# Patient Record
Sex: Female | Born: 1990 | Race: Black or African American | Hispanic: No | Marital: Single | Smoking: Former smoker
Health system: Southern US, Community
[De-identification: ages and names within clinical notes are randomized; demographics above are authoritative.]

## PROBLEM LIST (undated history)

## (undated) ENCOUNTER — Emergency Department (HOSPITAL_COMMUNITY): Admission: EM | Payer: Self-pay | Source: Home / Self Care

## (undated) DIAGNOSIS — L0232 Furuncle of buttock: Secondary | ICD-10-CM

## (undated) DIAGNOSIS — J189 Pneumonia, unspecified organism: Secondary | ICD-10-CM

## (undated) HISTORY — PX: WISDOM TOOTH EXTRACTION: SHX21

---

## 2012-02-17 ENCOUNTER — Encounter (HOSPITAL_BASED_OUTPATIENT_CLINIC_OR_DEPARTMENT_OTHER): Payer: Self-pay | Admitting: Student

## 2012-02-17 DIAGNOSIS — L03119 Cellulitis of unspecified part of limb: Secondary | ICD-10-CM | POA: Insufficient documentation

## 2012-02-17 DIAGNOSIS — L02419 Cutaneous abscess of limb, unspecified: Secondary | ICD-10-CM | POA: Insufficient documentation

## 2012-02-17 NOTE — ED Notes (Signed)
Pt in with c/o boils to back of both legs.

## 2012-02-18 ENCOUNTER — Emergency Department (HOSPITAL_BASED_OUTPATIENT_CLINIC_OR_DEPARTMENT_OTHER)
Admission: EM | Admit: 2012-02-18 | Discharge: 2012-02-18 | Disposition: A | Discharge status: Home / Self Care | Payer: Self-pay | Attending: Emergency Medicine | Admitting: Emergency Medicine

## 2012-02-18 DIAGNOSIS — L0291 Cutaneous abscess, unspecified: Secondary | ICD-10-CM

## 2012-02-18 MED ORDER — OXYCODONE-ACETAMINOPHEN 5-325 MG PO TABS: 1.0000 | ORAL_TABLET | ORAL | Status: AC | PRN

## 2012-02-18 MED ORDER — SULFAMETHOXAZOLE-TRIMETHOPRIM 800-160 MG PO TABS: 1.0000 | ORAL_TABLET | Freq: Two times a day (BID) | ORAL | Status: AC

## 2012-02-18 MED ORDER — OXYCODONE-ACETAMINOPHEN 5-325 MG PO TABS
1.0000 | ORAL_TABLET | Freq: Once | ORAL | Status: AC
  Administered 2012-02-18: 1 via ORAL
  Filled 2012-02-18: qty 1

## 2012-02-18 MED ORDER — LIDOCAINE-EPINEPHRINE 2 %-1:100000 IJ SOLN
INTRAMUSCULAR | Status: AC
  Filled 2012-02-18: qty 1

## 2012-02-18 NOTE — ED Provider Notes (Signed)
History     CSN: [id number]  Arrival date & time 02/17/12  2319   First MD Initiated Contact with Patient 02/18/12 [phone]    Chief Complaint  Patient presents with  . Abscess    both legs     Patient is a 21 y.o. female presenting with abscess. The history is provided by the patient.  Abscess  This is a new problem. The current episode started less than one week ago. The onset was gradual. The problem has been gradually worsening. Affected Location: bilateral posterior thighs. The problem is moderate. Pertinent negatives include no fever and no vomiting.  worsened by - palpation Improved by - rest  Pt reports abscesses to bilateral posterior thighs   PMH - none  History reviewed. No pertinent past surgical history.  History reviewed. No pertinent family history.  History  Substance Use Topics  . Smoking status: Current Everyday Smoker  . Smokeless tobacco: Not on file  . Alcohol Use: Yes    OB History    Grav Para Term Preterm Abortions TAB SAB Ect Mult Living                  Review of Systems  Constitutional: Negative for fever.  Gastrointestinal: Negative for vomiting.    Allergies  Review of patient's allergies indicates no known allergies.  Home Medications   Current Outpatient Rx  Name Route Sig Dispense Refill  . OXYCODONE-ACETAMINOPHEN 5-325 MG PO TABS Oral Take 1 tablet by mouth every 4 (four) hours as needed for pain. 15 tablet 0  . SULFAMETHOXAZOLE-TRIMETHOPRIM 800-160 MG PO TABS Oral Take 1 tablet by mouth every 12 (twelve) hours. 10 tablet 0    BP 121/78  Pulse 79  Temp(Src) 98.5 F (36.9 C) (Oral)  Resp 20  Ht 5\' 9"  (1.753 m)  Wt 280 lb (127.007 kg)  BMI 41.35 kg/m2  SpO2 100%  LMP 02/10/2012  Physical Exam CONSTITUTIONAL: Well developed/well nourished HEAD AND FACE: Normocephalic/atraumatic EYES: EOMI/PERRL ENMT: Mucous membranes moist NECK: supple no meningeal signs SPINE:entire spine nontender CV: S1/S2 noted, no  murmurs/rubs/gallops noted LUNGS: Lungs are clear to auscultation bilaterally, no apparent distress ABDOMEN: soft, nontender, no rebound or guarding NEURO: Pt is awake/alert, moves all extremitiesx4 EXTREMITIES: pulses normal, full ROM Pt has an abscess to the bilateral posterior thighs that are both actively draining with mild erythema surrounding them No crepitance noted.  There is no extension into buttocks.   SKIN: warm, color normal PSYCH: no abnormalities of mood noted   ED Course  Procedures     1. Abscess       MDM  Nursing notes reviewed and considered in documentation  abscesses are already draining and do not require surgical drainage, will start bactrim and pain meds  The patient appears reasonably screened and/or stabilized for discharge and I doubt any other medical condition or other [facility] requiring further screening, evaluation, or treatment in the ED at this time prior to discharge.         Joya Gaskins, MD 02/18/12 [phone]

## 2012-02-18 NOTE — ED Notes (Signed)
MD at bedside. 

## 2012-02-18 NOTE — Discharge Instructions (Signed)

## 2012-09-26 ENCOUNTER — Encounter (HOSPITAL_BASED_OUTPATIENT_CLINIC_OR_DEPARTMENT_OTHER): Payer: Self-pay | Admitting: *Deleted

## 2012-09-26 ENCOUNTER — Emergency Department (HOSPITAL_BASED_OUTPATIENT_CLINIC_OR_DEPARTMENT_OTHER)
Admission: EM | Admit: 2012-09-26 | Discharge: 2012-09-26 | Disposition: A | Discharge status: Home / Self Care | Payer: Self-pay | Attending: Emergency Medicine | Admitting: Emergency Medicine

## 2012-09-26 DIAGNOSIS — L0501 Pilonidal cyst with abscess: Secondary | ICD-10-CM | POA: Insufficient documentation

## 2012-09-26 HISTORY — DX: Furuncle of buttock: L02.32

## 2012-09-26 MED ORDER — LIDOCAINE HCL 2 % IJ SOLN
INTRAMUSCULAR | Status: AC
  Filled 2012-09-26: qty 20

## 2012-09-26 MED ORDER — HYDROCODONE-ACETAMINOPHEN 5-325 MG PO TABS: 2.0000 | ORAL_TABLET | ORAL | Status: AC | PRN

## 2012-09-26 MED ORDER — SULFAMETHOXAZOLE-TRIMETHOPRIM 800-160 MG PO TABS: 1.0000 | ORAL_TABLET | Freq: Two times a day (BID) | ORAL | Status: DC

## 2012-09-26 MED ORDER — HYDROCODONE-ACETAMINOPHEN 5-325 MG PO TABS
2.0000 | ORAL_TABLET | Freq: Once | ORAL | Status: AC
  Administered 2012-09-26: 2 via ORAL
  Filled 2012-09-26: qty 2

## 2012-09-26 NOTE — ED Provider Notes (Signed)
History     CSN: [id number]  Arrival date & time 09/26/12  2023   First MD Initiated Contact with Patient 09/26/12 2142      Chief Complaint  Patient presents with  . Recurrent Skin Infections    (Consider location/radiation/quality/duration/timing/severity/associated sxs/prior treatment) Patient is a 21 y.o. female presenting with abscess. The history is provided by the patient. No language interpreter was used.  Abscess  This is a new problem. The current episode started more than one week ago. The onset was gradual. The problem occurs continuously. The problem has been gradually worsening. The abscess is present on the left buttock. The problem is severe. The abscess is characterized by itchiness and redness. The abscess first occurred at home.  Pt reports she has had multiple abscess in the past  Past Medical History  Diagnosis Date  . Boil of buttock     Past Surgical History  Procedure Date  . Cesarean section   . Wisdom tooth extraction     No family history on file.  History  Substance Use Topics  . Smoking status: Never Smoker   . Smokeless tobacco: Never Used  . Alcohol Use: No    OB History    Grav Para Term Preterm Abortions TAB SAB Ect Mult Living                  Review of Systems  Skin: Positive for wound.  All other systems reviewed and are negative.    Allergies  Review of patient's allergies indicates no known allergies.  Home Medications  No current outpatient prescriptions on file.  BP 127/83  Pulse 111  Temp 98.7 F (37.1 C) (Oral)  Resp 20  SpO2 97%  LMP 09/17/2012  Physical Exam  Nursing note and vitals reviewed. Constitutional: She appears well-developed and well-nourished.  HENT:  Head: Normocephalic.  Eyes: Pupils are equal, round, and reactive to light.  Cardiovascular: Normal rate.   Pulmonary/Chest: Effort normal.  Abdominal: Soft.  Musculoskeletal: She exhibits tenderness.       Abscess pilonidal area,   fluctuant  Skin: Skin is warm.  Psychiatric: She has a normal mood and affect.    ED Course  INCISION AND DRAINAGE Date/Time: 09/26/2012 10:48 PM Performed by: Elson Areas Authorized by: Elson Areas Consent: Verbal consent not obtained. Risks and benefits: risks, benefits and alternatives were discussed Consent given by: patient Patient identity confirmed: verbally with patient Time out: Immediately prior to procedure a "time out" was called to verify the correct patient, procedure, equipment, support staff and site/side marked as required. Type: pilonidal cyst Body area: anogenital Location details: pilonidal Anesthesia: local infiltration Local anesthetic: lidocaine 2% without epinephrine Patient sedated: no Scalpel size: 11 Incision type: single straight Complexity: simple Drainage: purulent Drainage amount: copious Wound treatment: wound left open Packing material: 1/2 in iodoform gauze Patient tolerance: Patient tolerated the procedure well with no immediate complications.   (including critical care time)  Labs Reviewed - No data to display No results found.   No diagnosis found.    MDM  Pt advised to return in 2 days for recheck and packing removal.   Pt given rx for bactrim and hydrocodone        Lonia Skinner [place], [place] 09/26/12 2250

## 2012-09-26 NOTE — ED Notes (Signed)
Pt c/o boil on buttocks- hx of same over past 2 years

## 2012-09-26 NOTE — ED Provider Notes (Signed)
Medical screening examination/treatment/procedure(s) were performed by non-physician practitioner and as supervising physician I was immediately available for consultation/collaboration.   Jahyra Sukup, MD 09/26/12 2308 

## 2012-12-18 ENCOUNTER — Emergency Department (HOSPITAL_BASED_OUTPATIENT_CLINIC_OR_DEPARTMENT_OTHER)
Admission: EM | Admit: 2012-12-18 | Discharge: 2012-12-18 | Disposition: A | Discharge status: Home / Self Care | Payer: Self-pay | Attending: Emergency Medicine | Admitting: Emergency Medicine

## 2012-12-18 ENCOUNTER — Other Ambulatory Visit (HOSPITAL_BASED_OUTPATIENT_CLINIC_OR_DEPARTMENT_OTHER): Payer: Self-pay

## 2012-12-18 ENCOUNTER — Emergency Department (HOSPITAL_BASED_OUTPATIENT_CLINIC_OR_DEPARTMENT_OTHER): Payer: Self-pay

## 2012-12-18 ENCOUNTER — Encounter (HOSPITAL_BASED_OUTPATIENT_CLINIC_OR_DEPARTMENT_OTHER): Payer: Self-pay | Admitting: *Deleted

## 2012-12-18 ENCOUNTER — Ambulatory Visit (HOSPITAL_COMMUNITY)
Admission: RE | Admit: 2012-12-18 | Discharge: 2012-12-18 | Disposition: A | Discharge status: Home / Self Care | Payer: Self-pay | Source: Ambulatory Visit | Attending: Emergency Medicine | Admitting: Emergency Medicine

## 2012-12-18 DIAGNOSIS — R059 Cough, unspecified: Secondary | ICD-10-CM | POA: Insufficient documentation

## 2012-12-18 DIAGNOSIS — L0231 Cutaneous abscess of buttock: Secondary | ICD-10-CM | POA: Insufficient documentation

## 2012-12-18 DIAGNOSIS — R799 Abnormal finding of blood chemistry, unspecified: Secondary | ICD-10-CM | POA: Insufficient documentation

## 2012-12-18 DIAGNOSIS — L0390 Cellulitis, unspecified: Secondary | ICD-10-CM

## 2012-12-18 DIAGNOSIS — Z8701 Personal history of pneumonia (recurrent): Secondary | ICD-10-CM | POA: Insufficient documentation

## 2012-12-18 DIAGNOSIS — Z3202 Encounter for pregnancy test, result negative: Secondary | ICD-10-CM | POA: Insufficient documentation

## 2012-12-18 DIAGNOSIS — R791 Abnormal coagulation profile: Secondary | ICD-10-CM | POA: Insufficient documentation

## 2012-12-18 DIAGNOSIS — Z872 Personal history of diseases of the skin and subcutaneous tissue: Secondary | ICD-10-CM | POA: Insufficient documentation

## 2012-12-18 DIAGNOSIS — Z711 Person with feared health complaint in whom no diagnosis is made: Secondary | ICD-10-CM | POA: Insufficient documentation

## 2012-12-18 DIAGNOSIS — M79609 Pain in unspecified limb: Secondary | ICD-10-CM

## 2012-12-18 DIAGNOSIS — L03317 Cellulitis of buttock: Secondary | ICD-10-CM | POA: Insufficient documentation

## 2012-12-18 DIAGNOSIS — R091 Pleurisy: Secondary | ICD-10-CM | POA: Insufficient documentation

## 2012-12-18 HISTORY — DX: Pneumonia, unspecified organism: J18.9

## 2012-12-18 LAB — CBC WITH DIFFERENTIAL/PLATELET
Basophils Relative: 0 % (ref 0–1)
Eosinophils Relative: 1 % (ref 0–5)
Hemoglobin: 10.6 g/dL — ABNORMAL LOW (ref 12.0–15.0)
MCH: 21.6 pg — ABNORMAL LOW (ref 26.0–34.0)
MCV: 67.3 fL — ABNORMAL LOW (ref 78.0–100.0)
Monocytes Absolute: 1.2 10*3/uL — ABNORMAL HIGH (ref 0.1–1.0)
Monocytes Relative: 10 % (ref 3–12)
Neutrophils Relative %: 72 % (ref 43–77)
RBC: 4.9 MIL/uL (ref 3.87–5.11)

## 2012-12-18 LAB — BASIC METABOLIC PANEL
BUN: 11 mg/dL (ref 6–23)
Chloride: 104 mEq/L (ref 96–112)
GFR calc Af Amer: >90 mL/min (ref >90)
Potassium: 4.1 mEq/L (ref 3.5–5.1)
Sodium: 138 mEq/L (ref 135–145)

## 2012-12-18 LAB — PREGNANCY, URINE: Preg Test, Ur: NEGATIVE

## 2012-12-18 MED ORDER — SULFAMETHOXAZOLE-TRIMETHOPRIM 800-160 MG PO TABS: 1.0000 | ORAL_TABLET | Freq: Two times a day (BID) | ORAL | Status: DC

## 2012-12-18 MED ORDER — BENZONATATE 100 MG PO CAPS: 100.0000 mg | ORAL_CAPSULE | Freq: Three times a day (TID) | ORAL | Status: DC

## 2012-12-18 MED ORDER — HYDROCODONE-ACETAMINOPHEN 5-325 MG PO TABS: 1.0000 | ORAL_TABLET | Freq: Four times a day (QID) | ORAL | Status: DC | PRN

## 2012-12-18 MED ORDER — SULFAMETHOXAZOLE-TMP DS 800-160 MG PO TABS
1.0000 | ORAL_TABLET | Freq: Once | ORAL | Status: AC
  Administered 2012-12-18: 1 via ORAL
  Filled 2012-12-18: qty 1

## 2012-12-18 MED ORDER — OXYCODONE-ACETAMINOPHEN 5-325 MG PO TABS
1.0000 | ORAL_TABLET | Freq: Once | ORAL | Status: AC
  Administered 2012-12-18: 1 via ORAL
  Filled 2012-12-18 (×2): qty 1

## 2012-12-18 MED ORDER — IOHEXOL 350 MG/ML SOLN
125.0000 mL | Freq: Once | INTRAVENOUS | Status: AC | PRN
  Administered 2012-12-18: 125 mL via INTRAVENOUS

## 2012-12-18 MED ORDER — NAPROXEN 375 MG PO TABS: 375.0000 mg | ORAL_TABLET | Freq: Two times a day (BID) | ORAL | Status: DC

## 2012-12-18 NOTE — ED Notes (Signed)
Patient returned from xray.

## 2012-12-18 NOTE — ED Notes (Signed)
Explained to patient that they were to report to [facility] ED to be seen in radiology for an ultrasound as an out patient, patient verbalized understanding

## 2012-12-18 NOTE — ED Notes (Signed)
Patient reports two weeks of pain in her lungs/ribs and an abscess area in her sacrum for about 4 days

## 2012-12-18 NOTE — ED Notes (Signed)
Opened chart to obtain Vascular order and clarify location of test.

## 2012-12-18 NOTE — ED Provider Notes (Signed)
History     CSN: [id number]  Arrival date & time 12/18/12  0508   First MD Initiated Contact with Patient 12/18/12 0518      Chief Complaint  Patient presents with  . Recurrent Skin Infections    (Consider location/radiation/quality/duration/timing/severity/associated sxs/prior treatment) Patient is a 22 y.o. female presenting with cough. The history is provided by the patient.  Cough Cough characteristics:  Non-productive Onset quality:  Gradual Duration:  3 weeks Timing:  Intermittent Progression:  Unchanged Chronicity:  New Smoker: no   Context: not sick contacts   Relieved by:  Nothing Worsened by:  Nothing tried Ineffective treatments:  None tried Risk factors: no recent travel   rib pain with coughing, non-productive x 3 weeks.  Also is concerned an abscess that she had in her gluteal region that was drained in December has returned.  She has had burning in that area and has been putting Vick's vapor rub on it. No fevers, no drainage   Past Medical History  Diagnosis Date  . Boil of buttock   . Pneumonia     Past Surgical History  Procedure Laterality Date  . Cesarean section    . Wisdom tooth extraction      No family history on file.  History  Substance Use Topics  . Smoking status: Never Smoker   . Smokeless tobacco: Never Used  . Alcohol Use: No    OB History   Grav Para Term Preterm Abortions TAB SAB Ect Mult Living                  Review of Systems  Respiratory: Positive for cough.   Cardiovascular: Negative for palpitations and leg swelling.  All other systems reviewed and are negative.    Allergies  Latex  Home Medications   Current Outpatient Rx  Name  Route  Sig  Dispense  Refill  . acetaminophen (TYLENOL) 325 MG tablet   Oral   Take 650 mg by mouth every 6 (six) hours as needed for pain.         . Etonogestrel (IMPLANON [place])   Subcutaneous   Inject into the skin.         Marland Kitchen sulfamethoxazole-trimethoprim (SEPTRA DS)  800-160 MG per tablet   Oral   Take 1 tablet by mouth every 12 (twelve) hours.   20 tablet   0     BP 130/75  Pulse 90  Temp(Src) 98.8 F (37.1 C) (Oral)  Resp 22  SpO2 99%  LMP 12/14/2012  Physical Exam  Constitutional: She is oriented to person, place, and time. She appears well-developed and well-nourished. No distress.  HENT:  Head: Normocephalic and atraumatic.  Mouth/Throat: Oropharynx is clear and moist.  Eyes: Conjunctivae are normal. Pupils are equal, round, and reactive to light.  Neck: Normal range of motion. Neck supple.  Cardiovascular: Normal rate, regular rhythm and intact distal pulses.   Pulmonary/Chest: Effort normal and breath sounds normal. No respiratory distress. She has no wheezes. She has no rales.  Abdominal: Soft. Bowel sounds are normal. There is no tenderness. There is no rebound and no guarding.  Musculoskeletal: Normal range of motion.  Neurological: She is alert and oriented to person, place, and time.  Skin: Skin is warm and dry.  Psychiatric: She has a normal mood and affect.    ED Course  Procedures (including critical care time)  Labs Reviewed  PREGNANCY, URINE  D-DIMER, QUANTITATIVE   No results found.   No diagnosis found.  MDM  Cellulitis: area is without fluctuance and no fluid collections seen on bedside US will prescribe antibiotics for cellulitis.  For rib pain and cough attempted rule out with ddimer which was elevated.  CT angio of chest negative.  Will treat for pain and cough.   have set up outpatient dopplers of Bilateral lower extremities to exclude DVT.  Patient to wait for results.          Jasmine Awe, MD 12/18/12 [phone]

## 2012-12-19 ENCOUNTER — Encounter (HOSPITAL_BASED_OUTPATIENT_CLINIC_OR_DEPARTMENT_OTHER): Payer: Self-pay | Admitting: *Deleted

## 2012-12-19 ENCOUNTER — Emergency Department (HOSPITAL_BASED_OUTPATIENT_CLINIC_OR_DEPARTMENT_OTHER)
Admission: EM | Admit: 2012-12-19 | Discharge: 2012-12-20 | Disposition: A | Discharge status: Home / Self Care | Payer: Self-pay | Attending: Emergency Medicine | Admitting: Emergency Medicine

## 2012-12-19 DIAGNOSIS — Z79899 Other long term (current) drug therapy: Secondary | ICD-10-CM | POA: Insufficient documentation

## 2012-12-19 DIAGNOSIS — Z8701 Personal history of pneumonia (recurrent): Secondary | ICD-10-CM | POA: Insufficient documentation

## 2012-12-19 DIAGNOSIS — F17200 Nicotine dependence, unspecified, uncomplicated: Secondary | ICD-10-CM | POA: Insufficient documentation

## 2012-12-19 DIAGNOSIS — L0501 Pilonidal cyst with abscess: Secondary | ICD-10-CM | POA: Insufficient documentation

## 2012-12-19 DIAGNOSIS — M549 Dorsalgia, unspecified: Secondary | ICD-10-CM | POA: Insufficient documentation

## 2012-12-19 NOTE — ED Provider Notes (Signed)
History     CSN: [id number]  Arrival date & time 12/19/12  2222   First MD Initiated Contact with Patient 12/19/12 2303      Chief Complaint  Patient presents with  . boil on buttocks     HPI Sierra Kirk is a 22 y.o. female who presents to the ED with an abscess. This is a recurrent problem. She was here earlier in the week with pain at the base of her spine. The area was not ready for I&D at that time. She has been on antibiotics, pain medication and using warm wet compresses. The pain has increased. She has had the same problem in the past and had the area drained.   Past Medical History  Diagnosis Date  . Boil of buttock   . Pneumonia     Past Surgical History  Procedure Laterality Date  . Cesarean section    . Wisdom tooth extraction      No family history on file.  History  Substance Use Topics  . Smoking status: Current Every Day Smoker    Types: Cigarettes  . Smokeless tobacco: Never Used  . Alcohol Use: Yes     Comment: drinks socially    OB History   Grav Para Term Preterm Abortions TAB SAB Ect Mult Living                  Review of Systems  Constitutional: Negative for fever and appetite change.  HENT: Negative.   Respiratory: Negative for cough and wheezing.   Cardiovascular: Negative for leg swelling.  Gastrointestinal: Negative for nausea, vomiting and abdominal pain.  Genitourinary: Negative for dysuria.  Musculoskeletal: Positive for back pain.  Skin: Positive for wound.  Neurological: Negative for headaches.  Psychiatric/Behavioral: Negative for confusion. The patient is not nervous/anxious.     Allergies  Latex  Home Medications   Current Outpatient Rx  Name  Route  Sig  Dispense  Refill  . acetaminophen (TYLENOL) 325 MG tablet   Oral   Take 650 mg by mouth every 6 (six) hours as needed for pain.         . benzonatate (TESSALON) 100 MG capsule   Oral   Take 1 capsule (100 mg total) by mouth every 8 (eight) hours.   21 capsule   0   . Etonogestrel (IMPLANON [place])   Subcutaneous   Inject into the skin.         Marland Kitchen HYDROcodone-acetaminophen (NORCO) 5-325 MG per tablet   Oral   Take 1 tablet by mouth every 6 (six) hours as needed for pain.   11 tablet   0   . naproxen (NAPROSYN) 375 MG tablet   Oral   Take 1 tablet (375 mg total) by mouth 2 (two) times daily.   20 tablet   0   . sulfamethoxazole-trimethoprim (SEPTRA DS) 800-160 MG per tablet   Oral   Take 1 tablet by mouth every 12 (twelve) hours.   20 tablet   0   . sulfamethoxazole-trimethoprim (SEPTRA DS) 800-160 MG per tablet   Oral   Take 1 tablet by mouth every 12 (twelve) hours.   14 tablet   0     BP 124/87  Pulse 87  Temp(Src) 98.1 F (36.7 C) (Oral)  Resp 19  Ht 5\' 11"  (1.803 m)  Wt 295 lb (133.811 kg)  BMI 41.16 kg/m2  SpO2 100%  LMP 12/14/2012  Physical Exam  Nursing note and vitals reviewed. Constitutional: She is  oriented to person, place, and time. She appears well-developed and well-nourished.  HENT:  Head: Normocephalic and atraumatic.  Eyes: EOM are normal. Pupils are equal, round, and reactive to light.  Neck: Normal range of motion. Neck supple.  Cardiovascular: Normal rate.   Pulmonary/Chest: Effort normal.  Musculoskeletal: Normal range of motion.  Neurological: She is alert and oriented to person, place, and time. No cranial nerve deficit.  Skin: Skin is warm and dry.     Abscess, tender, fluctuant   Psychiatric: She has a normal mood and affect. Her behavior is normal. Judgment and thought content normal.   Procedures  INCISION AND DRAINAGE Performed by: NEESE,HOPE Consent: Verbal consent obtained. Risks and benefits: risks, benefits and alternatives were discussed Type: abscess  Body area: buttock   Anesthesia: local infiltration  Incision was made with a #11 blade scalpel.  Local anesthetic: lidocaine 2% without epinephrine  Anesthetic total: 3 ml  Complexity: complex Blunt dissection to  break up loculations  Drainage: purulent  Drainage amount: very large amount  Packing material: 1/4 in iodoform gauze  Patient tolerance: Patient tolerated the procedure well with no immediate complications.   Assessment: Pilonidal abscess  Plan:  Continue antibiotics   Warm wet compresses   Return in 2 days for packing removal   Referral to [facility] Discussed with the patient and all questioned fully answered.    Medication List    ASK your doctor about these medications       acetaminophen 325 MG tablet  Commonly known as:  TYLENOL  Take 650 mg by mouth every 6 (six) hours as needed for pain.     benzonatate 100 MG capsule  Commonly known as:  TESSALON  Take 1 capsule (100 mg total) by mouth every 8 (eight) hours.     HYDROcodone-acetaminophen 5-325 MG per tablet  Commonly known as:  NORCO  Take 1 tablet by mouth every 6 (six) hours as needed for pain.     IMPLANON [place]  Inject into the skin.     naproxen 375 MG tablet  Commonly known as:  NAPROSYN  Take 1 tablet (375 mg total) by mouth 2 (two) times daily.     sulfamethoxazole-trimethoprim 800-160 MG per tablet  Commonly known as:  SEPTRA DS  Take 1 tablet by mouth every 12 (twelve) hours.     sulfamethoxazole-trimethoprim 800-160 MG per tablet  Commonly known as:  SEPTRA DS  Take 1 tablet by mouth every 12 (twelve) hours.         [facility] Orlene Och, [place] 12/20/12 0005

## 2012-12-19 NOTE — ED Notes (Signed)
Patient has a raised hardened area at her sacrum. She states that it is painful and has increased in size since this morning.

## 2012-12-19 NOTE — ED Notes (Addendum)
Patient complains with pain related to boil on lower back between buttocks. Denies fever and drainage from boil. Pt complains of chills and feeling light headed.

## 2012-12-19 NOTE — Progress Notes (Signed)
VASCULAR LAB PRELIMINARY  PRELIMINARY  PRELIMINARY  PRELIMINARY  Bilateral lower extremity venous Dopplers completed.    Preliminary report:  There is no DVT or SVT noted in the bilateral lower extremities.  Daymeon Fischman, RVT 12/19/2012, 6:32 PM

## 2012-12-20 NOTE — Procedures (Signed)
Assisted physician with excising a boil on patients buttocks. After procedure I applied dressing to  wound with abdominal gauze and medafore tape. Pt tolerated well.

## 2012-12-20 NOTE — ED Provider Notes (Signed)
Medical screening examination/treatment/procedure(s) were performed by non-physician practitioner and as supervising physician I was immediately available for consultation/collaboration.  April Smitty Cords, MD 12/20/12 [phone]

## 2015-01-11 ENCOUNTER — Emergency Department (HOSPITAL_BASED_OUTPATIENT_CLINIC_OR_DEPARTMENT_OTHER)
Admission: EM | Admit: 2015-01-11 | Discharge: 2015-01-11 | Disposition: A | Discharge status: Home / Self Care | Payer: Medicaid Other | Attending: Emergency Medicine | Admitting: Emergency Medicine

## 2015-01-11 ENCOUNTER — Encounter (HOSPITAL_BASED_OUTPATIENT_CLINIC_OR_DEPARTMENT_OTHER): Payer: Self-pay | Admitting: *Deleted

## 2015-01-11 DIAGNOSIS — Z791 Long term (current) use of non-steroidal anti-inflammatories (NSAID): Secondary | ICD-10-CM | POA: Diagnosis not present

## 2015-01-11 DIAGNOSIS — Z720 Tobacco use: Secondary | ICD-10-CM | POA: Diagnosis not present

## 2015-01-11 DIAGNOSIS — Z8701 Personal history of pneumonia (recurrent): Secondary | ICD-10-CM | POA: Insufficient documentation

## 2015-01-11 DIAGNOSIS — L0501 Pilonidal cyst with abscess: Secondary | ICD-10-CM | POA: Diagnosis not present

## 2015-01-11 DIAGNOSIS — Z91040 Latex allergy status: Secondary | ICD-10-CM | POA: Insufficient documentation

## 2015-01-11 MED ORDER — OXYCODONE-ACETAMINOPHEN 5-325 MG PO TABS: 1.0000 | ORAL_TABLET | Freq: Four times a day (QID) | ORAL | Status: DC | PRN

## 2015-01-11 MED ORDER — LIDOCAINE HCL (PF) 1 % IJ SOLN
5.0000 mL | Freq: Once | INTRAMUSCULAR | Status: AC
  Administered 2015-01-11: 5 mL

## 2015-01-11 MED ORDER — LIDOCAINE HCL (PF) 1 % IJ SOLN
INTRAMUSCULAR | Status: AC
  Filled 2015-01-11: qty 5

## 2015-01-11 MED ORDER — OXYCODONE-ACETAMINOPHEN 5-325 MG PO TABS
2.0000 | ORAL_TABLET | Freq: Once | ORAL | Status: AC
  Administered 2015-01-11: 2 via ORAL
  Filled 2015-01-11: qty 2

## 2015-01-11 NOTE — ED Notes (Signed)
D/c home with instructions for f/u- directed to pharmacy to pickup meds

## 2015-01-11 NOTE — Discharge Instructions (Signed)
Continue doxycycline as prescribed.  Percocet as needed for pain.  Perform warm soaks as often as possible for the next 2-3 days.   Incision and Drainage Incision and drainage is a procedure in which a sac-like structure (cystic structure) is opened and drained. The area to be drained usually contains material such as pus, fluid, or blood.  LET YOUR CAREGIVER KNOW ABOUT:   Allergies to medicine.  Medicines taken, including vitamins, herbs, eyedrops, over-the-counter medicines, and creams.  Use of steroids (by mouth or creams).  Previous problems with anesthetics or numbing medicines.  History of bleeding problems or blood clots.  Previous surgery.  Other health problems, including diabetes and kidney problems.  Possibility of pregnancy, if this applies. RISKS AND COMPLICATIONS  Pain.  Bleeding.  Scarring.  Infection. BEFORE THE PROCEDURE  You may need to have an ultrasound or other imaging tests to see how large or deep your cystic structure is. Blood tests may also be used to determine if you have an infection or how severe the infection is. You may need to have a tetanus shot. PROCEDURE  The affected area is cleaned with a cleaning fluid. The cyst area will then be numbed with a medicine (local anesthetic). A small incision will be made in the cystic structure. A syringe or catheter may be used to drain the contents of the cystic structure, or the contents may be squeezed out. The area will then be flushed with a cleansing solution. After cleansing the area, it is often gently packed with a gauze or another wound dressing. Once it is packed, it will be covered with gauze and tape or some other type of wound dressing. AFTER THE PROCEDURE   Often, you will be allowed to go home right after the procedure.  You may be given antibiotic medicine to prevent or heal an infection.  If the area was packed with gauze or some other wound dressing, you will likely need to come back  in 1 to 2 days to get it removed.  The area should heal in about 14 days. Document Released: 04/01/2001 Document Revised: 04/06/2012 Document Reviewed: 12/01/2011 [facility] Patient Information 2015 [employer] [place]. This information is not intended to replace advice given to you by your health care provider. Make sure you discuss any questions you have with your health care provider.

## 2015-01-11 NOTE — ED Notes (Signed)
Abscess to her buttocks.

## 2015-01-11 NOTE — ED Provider Notes (Signed)
CSN: [id number]     Arrival date & time 01/11/15  1242 History   First MD Initiated Contact with Patient 01/11/15 1334     Chief Complaint  Patient presents with  . Abscess     (Consider location/radiation/quality/duration/timing/severity/associated sxs/prior Treatment) Patient is a 24 y.o. female presenting with abscess. The history is provided by the patient.  Abscess Abscess location: pilonidal cyst. Abscess quality: fluctuance, induration and painful   Progression:  Worsening Pain details:    Quality:  Pressure   Severity:  Severe   Duration:  3 days   Timing:  Constant   Progression:  Worsening Chronicity:  New Relieved by:  Nothing Worsened by:  Nothing tried   Past Medical History  Diagnosis Date  . Boil of buttock   . Pneumonia    Past Surgical History  Procedure Laterality Date  . Cesarean section    . Wisdom tooth extraction     No family history on file. History  Substance Use Topics  . Smoking status: Current Every Day Smoker    Types: Cigarettes  . Smokeless tobacco: Never Used  . Alcohol Use: Yes     Comment: drinks socially   OB History    No data available     Review of Systems  All other systems reviewed and are negative.     Allergies  Latex  Home Medications   Prior to Admission medications   Medication Sig Start Date End Date Taking? Authorizing Provider  DOXYCYCLINE PO Take by mouth.   Yes Historical Provider, MD  acetaminophen (TYLENOL) 325 MG tablet Take 650 mg by mouth every 6 (six) hours as needed for pain.    Historical Provider, MD  benzonatate (TESSALON) 100 MG capsule Take 1 capsule (100 mg total) by mouth every 8 (eight) hours. 12/18/12   April Palumbo, MD  Etonogestrel [facility]) Inject into the skin.    Historical Provider, MD  HYDROcodone-acetaminophen (NORCO) 5-325 MG per tablet Take 1 tablet by mouth every 6 (six) hours as needed for pain. 12/18/12   April Palumbo, MD  naproxen (NAPROSYN) 375 MG tablet Take 1 tablet  (375 mg total) by mouth 2 (two) times daily. 12/18/12   April Palumbo, MD  sulfamethoxazole-trimethoprim (SEPTRA DS) 800-160 MG per tablet Take 1 tablet by mouth every 12 (twelve) hours. 09/26/12   Elson Areas, PA-C  sulfamethoxazole-trimethoprim (SEPTRA DS) 800-160 MG per tablet Take 1 tablet by mouth every 12 (twelve) hours. 12/18/12   April Palumbo, MD   BP 138/92 mmHg  Pulse 84  Temp(Src) 98.3 F (36.8 C) (Oral)  Resp 20  Ht  (1.803 m)  Wt 295 lb (133.811 kg)  BMI 41.16 kg/m2  SpO2 100%  LMP 01/10/2015 Physical Exam  Constitutional: She is oriented to person, place, and time. She appears well-developed and well-nourished. No distress.  HENT:  Head: Normocephalic and atraumatic.  Mouth/Throat: Oropharynx is clear and moist.  Neck: Normal range of motion. Neck supple.  Neurological: She is alert and oriented to person, place, and time.  Skin: Skin is warm and dry. She is not diaphoretic.  There is a large, fluctuant area to the pilonidal area.  Nursing note and vitals reviewed.   ED Course  Procedures (including critical care time) Labs Review Labs Reviewed - No data to display  Imaging Review No results found.   EKG Interpretation None     INCISION AND DRAINAGE Performed by: Geoffery Lyons Consent: Verbal consent obtained. Risks and benefits: risks, benefits and alternatives  were discussed Type: abscess  Body area: buttock  Anesthesia: local infiltration  Incision was made with a scalpel.  Local anesthetic: lidocaine 1% without epinephrine  Anesthetic total: 3 ml  Complexity: complex Blunt dissection to break up loculations  Drainage: purulent  Drainage amount: large  Packing material: none  Patient tolerance: Patient tolerated the procedure well with no immediate complications.    MDM   Final diagnoses:  None    Patient already taking doxycycline and I will advise her to continue this. Will also recommend warm soaks and treat with pain  medication. To return as needed.    Geoffery Lyonsouglas Jodeen Mclin, MD 01/11/15 1357

## 2015-06-22 ENCOUNTER — Emergency Department (HOSPITAL_BASED_OUTPATIENT_CLINIC_OR_DEPARTMENT_OTHER)
Admission: EM | Admit: 2015-06-22 | Discharge: 2015-06-22 | Disposition: A | Discharge status: Home / Self Care | Payer: Medicaid Other | Attending: Emergency Medicine | Admitting: Emergency Medicine

## 2015-06-22 ENCOUNTER — Emergency Department (HOSPITAL_BASED_OUTPATIENT_CLINIC_OR_DEPARTMENT_OTHER): Payer: Medicaid Other

## 2015-06-22 ENCOUNTER — Encounter (HOSPITAL_BASED_OUTPATIENT_CLINIC_OR_DEPARTMENT_OTHER): Payer: Self-pay

## 2015-06-22 DIAGNOSIS — Z91040 Latex allergy status: Secondary | ICD-10-CM | POA: Diagnosis not present

## 2015-06-22 DIAGNOSIS — N390 Urinary tract infection, site not specified: Secondary | ICD-10-CM | POA: Insufficient documentation

## 2015-06-22 DIAGNOSIS — Z720 Tobacco use: Secondary | ICD-10-CM | POA: Insufficient documentation

## 2015-06-22 DIAGNOSIS — R079 Chest pain, unspecified: Secondary | ICD-10-CM | POA: Diagnosis not present

## 2015-06-22 DIAGNOSIS — R110 Nausea: Secondary | ICD-10-CM | POA: Diagnosis not present

## 2015-06-22 DIAGNOSIS — R0602 Shortness of breath: Secondary | ICD-10-CM | POA: Insufficient documentation

## 2015-06-22 DIAGNOSIS — R05 Cough: Secondary | ICD-10-CM | POA: Insufficient documentation

## 2015-06-22 DIAGNOSIS — R200 Anesthesia of skin: Secondary | ICD-10-CM | POA: Insufficient documentation

## 2015-06-22 DIAGNOSIS — R55 Syncope and collapse: Secondary | ICD-10-CM | POA: Insufficient documentation

## 2015-06-22 DIAGNOSIS — Z8701 Personal history of pneumonia (recurrent): Secondary | ICD-10-CM | POA: Insufficient documentation

## 2015-06-22 LAB — URINE MICROSCOPIC-ADD ON

## 2015-06-22 LAB — URINALYSIS, ROUTINE W REFLEX MICROSCOPIC
Bilirubin Urine: NEGATIVE
GLUCOSE, UA: NEGATIVE mg/dL
HGB URINE DIPSTICK: NEGATIVE
Ketones, ur: NEGATIVE mg/dL
Nitrite: NEGATIVE
PROTEIN: NEGATIVE mg/dL
SPECIFIC GRAVITY, URINE: 1.027 (ref 1.005–1.030)
Urobilinogen, UA: 0.2 mg/dL (ref 0.0–1.0)
pH: 7 (ref 5.0–8.0)

## 2015-06-22 LAB — BASIC METABOLIC PANEL
Anion gap: 8 (ref 5–15)
BUN: 10 mg/dL (ref 6–20)
CHLORIDE: 106 mmol/L (ref 101–111)
CO2: 26 mmol/L (ref 22–32)
CREATININE: 0.76 mg/dL (ref 0.44–1.00)
Calcium: 9 mg/dL (ref 8.9–10.3)
GFR calc Af Amer: >60 mL/min (ref >60)
GFR calc non Af Amer: >60 mL/min (ref >60)
GLUCOSE: 86 mg/dL (ref 65–99)
POTASSIUM: 3.8 mmol/L (ref 3.5–5.1)
SODIUM: 140 mmol/L (ref 135–145)

## 2015-06-22 LAB — TROPONIN I: Troponin I: <0.03 ng/mL (ref <0.031)

## 2015-06-22 LAB — CBC
HEMATOCRIT: 33.3 % — AB (ref 36.0–46.0)
HEMOGLOBIN: 10.7 g/dL — AB (ref 12.0–15.0)
MCH: 21.7 pg — AB (ref 26.0–34.0)
MCHC: 32.1 g/dL (ref 30.0–36.0)
MCV: 67.4 fL — AB (ref 78.0–100.0)
Platelets: 244 10*3/uL (ref 150–400)
RBC: 4.94 MIL/uL (ref 3.87–5.11)
RDW: 15.3 % (ref 11.5–15.5)
WBC: 7.6 10*3/uL (ref 4.0–10.5)

## 2015-06-22 MED ORDER — CIPROFLOXACIN HCL 500 MG PO TABS: 500.0000 mg | ORAL_TABLET | Freq: Once | ORAL | Status: DC

## 2015-06-22 MED ORDER — CIPROFLOXACIN HCL 500 MG PO TABS
500.0000 mg | ORAL_TABLET | Freq: Once | ORAL | Status: AC
  Administered 2015-06-22: 500 mg via ORAL
  Filled 2015-06-22: qty 1

## 2015-06-22 NOTE — ED Notes (Signed)
Patient states she has had chest pain x 3weeks. She has diarrhea and has been nauseated for a week.  She is coughing up mucous.

## 2015-06-22 NOTE — ED Notes (Signed)
C/o CP x 3 weeks-diarrhea x 1.5 weeks

## 2015-06-22 NOTE — Discharge Instructions (Signed)
Chest Pain (Nonspecific) °It is often hard to give a specific diagnosis for the cause of chest pain. There is always a chance that your pain could be related to something serious, such as a heart attack or a blood clot in the lungs. You need to follow up with your health care provider for further evaluation. °CAUSES  °· Heartburn. °· Pneumonia or bronchitis. °· Anxiety or stress. °· Inflammation around your heart (pericarditis) or lung (pleuritis or pleurisy). °· A blood clot in the lung. °· A collapsed lung (pneumothorax). It can develop suddenly on its own (spontaneous pneumothorax) or from trauma to the chest. °· Shingles infection (herpes zoster virus). °The chest wall is composed of bones, muscles, and cartilage. Any of these can be the source of the pain. °· The bones can be bruised by injury. °· The muscles or cartilage can be strained by coughing or overwork. °· The cartilage can be affected by inflammation and become sore (costochondritis). °DIAGNOSIS  °Lab tests or other studies may be needed to find the cause of your pain. Your health care provider may have you take a test called an ambulatory electrocardiogram (ECG). An ECG records your heartbeat patterns over a 24-hour period. You may also have other tests, such as: °· Transthoracic echocardiogram (TTE). During echocardiography, sound waves are used to evaluate how blood flows through your heart. °· Transesophageal echocardiogram (TEE). °· Cardiac monitoring. This allows your health care provider to monitor your heart rate and rhythm in real time. °· Holter monitor. This is a portable device that records your heartbeat and can help diagnose heart arrhythmias. It allows your health care provider to track your heart activity for several days, if needed. °· Stress tests by exercise or by giving medicine that makes the heart beat faster. °TREATMENT  °· Treatment depends on what may be causing your chest pain. Treatment may include: °¨ Acid blockers for  heartburn. °¨ Anti-inflammatory medicine. °¨ Pain medicine for inflammatory conditions. °¨ Antibiotics if an infection is present. °· You may be advised to change lifestyle habits. This includes stopping smoking and avoiding alcohol, caffeine, and chocolate. °· You may be advised to keep your head raised (elevated) when sleeping. This reduces the chance of acid going backward from your stomach into your esophagus. °Most of the time, nonspecific chest pain will improve within 2-3 days with rest and mild pain medicine.  °HOME CARE INSTRUCTIONS  °· If antibiotics were prescribed, take them as directed. Finish them even if you start to feel better. °· For the next few days, avoid physical activities that bring on chest pain. Continue physical activities as directed. °· Do not use any tobacco products, including cigarettes, chewing tobacco, or electronic cigarettes. °· Avoid drinking alcohol. °· Only take medicine as directed by your health care provider. °· Follow your health care provider's suggestions for further testing if your chest pain does not go away. °· Keep any follow-up appointments you made. If you do not go to an appointment, you could develop lasting (chronic) problems with pain. If there is any problem keeping an appointment, call to reschedule. °SEEK MEDICAL CARE IF:  °· Your chest pain does not go away, even after treatment. °· You have a rash with blisters on your chest. °· You have a fever. °SEEK IMMEDIATE MEDICAL CARE IF:  °· You have increased chest pain or pain that spreads to your arm, neck, jaw, back, or abdomen. °· You have shortness of breath. °· You have an increasing cough, or you cough   up blood. °· You have severe back or abdominal pain. °· You feel nauseous or vomit. °· You have severe weakness. °· You faint. °· You have chills. °This is an emergency. Do not wait to see if the pain will go away. Get medical help at once. Call your local emergency services (911 in [place] Do not drive  yourself to the hospital. °MAKE SURE YOU:  °· Understand these instructions. °· Will watch your condition. °· Will get help right away if you are not doing well or get worse. °Document Released: 07/16/2005 Document Revised: 10/11/2013 Document Reviewed: 05/11/2008 °ExitCare® Patient Information ©2015 [employer]. This information is not intended to replace advice given to you by your health care provider. Make sure you discuss any questions you have with your health care provider. ° °Urinary Tract Infection °Urinary tract infections (UTIs) can develop anywhere along your urinary tract. Your urinary tract is your body's drainage system for removing wastes and extra water. Your urinary tract includes two kidneys, two ureters, a bladder, and a urethra. Your kidneys are a pair of bean-shaped organs. Each kidney is about the size of your fist. They are located below your ribs, one on each side of your spine. °CAUSES °Infections are caused by microbes, which are microscopic organisms, including fungi, viruses, and bacteria. These organisms are so small that they can only be seen through a microscope. Bacteria are the microbes that most commonly cause UTIs. °SYMPTOMS  °Symptoms of UTIs may vary by age and gender of the patient and by the location of the infection. Symptoms in young women typically include a frequent and intense urge to urinate and a painful, burning feeling in the bladder or urethra during urination. Older women and men are more likely to be tired, shaky, and weak and have muscle aches and abdominal pain. A fever may mean the infection is in your kidneys. Other symptoms of a kidney infection include pain in your back or sides below the ribs, nausea, and vomiting. °DIAGNOSIS °To diagnose a UTI, your caregiver will ask you about your symptoms. Your caregiver also will ask to provide a urine sample. The urine sample will be tested for bacteria and white blood cells. White blood cells are made by your body  to help fight infection. °TREATMENT  °Typically, UTIs can be treated with medication. Because most UTIs are caused by a bacterial infection, they usually can be treated with the use of antibiotics. The choice of antibiotic and length of treatment depend on your symptoms and the type of bacteria causing your infection. °HOME CARE INSTRUCTIONS °· If you were prescribed antibiotics, take them exactly as your caregiver instructs you. Finish the medication even if you feel better after you have only taken some of the medication. °· Drink enough water and fluids to keep your urine clear or pale yellow. °· Avoid caffeine, tea, and carbonated beverages. They tend to irritate your bladder. °· Empty your bladder often. Avoid holding urine for long periods of time. °· Empty your bladder before and after sexual intercourse. °· After a bowel movement, women should cleanse from front to back. Use each tissue only once. °SEEK MEDICAL CARE IF:  °· You have back pain. °· You develop a fever. °· Your symptoms do not begin to resolve within 3 days. °SEEK IMMEDIATE MEDICAL CARE IF:  °· You have severe back pain or lower abdominal pain. °· You develop chills. °· You have nausea or vomiting. °· You have continued burning or discomfort with urination. °MAKE SURE   YOU:  °· Understand these instructions. °· Will watch your condition. °· Will get help right away if you are not doing well or get worse. °Document Released: 07/16/2005 Document Revised: 04/06/2012 Document Reviewed: 11/14/2011 °ExitCare® Patient Information ©2015 [employer]. This information is not intended to replace advice given to you by your health care provider. Make sure you discuss any questions you have with your health care provider. ° °

## 2015-06-22 NOTE — ED Provider Notes (Signed)
CSN: [id number]     Arrival date & time 06/22/15  1841 History  This chart was scribe for Elwin Mocha, MD by Angelene Giovanni, ED Scribe. The patient was seen in room MH08/MH08 and the patient's care was started at 7:01 PM.     Chief Complaint  Patient presents with  . Chest Pain    Patient is a 24 y.o. female presenting with chest pain. The history is provided by the patient. No language interpreter was used.  Chest Pain Pain location:  Unable to specify Pain radiates to:  Does not radiate Pain radiates to the back: no   Pain severity:  Moderate Onset quality:  Sudden Duration:  2 weeks Timing:  Intermittent Progression:  Worsening Chronicity:  Recurrent Context: drug use   Context: not eating   Relieved by:  None tried Worsened by:  Nothing tried Ineffective treatments:  None tried Associated symptoms: cough, nausea, numbness, shortness of breath and syncope   Associated symptoms: no fever and not vomiting   Cough:    Cough characteristics:  Productive   Sputum characteristics:  Unable to specify   Severity:  Mild   Onset quality:  Gradual   Duration:  2 weeks   Timing:  Intermittent   Progression:  Unchanged   Chronicity:  Recurrent Nausea:    Severity:  Moderate   Onset quality:  Gradual   Duration:  2 weeks   Timing:  Intermittent   Progression:  Unchanged Shortness of breath:    Severity:  Moderate   Onset quality:  Gradual   Duration:  2 weeks   Timing:  Intermittent   Progression:  Worsening Syncope:    Witnessed: no     Suspicion of head trauma:  No Risk factors: smoking    HPI Comments: Sierra Kirk is a 24 y.o. female who presents to the Emergency Department complaining of gradually worsening intermittent moderate CP that lasts about 30 seconds onset 3 weeks ago. She reports associated productive cough, SOB, nausea, and numbness in her arms and legs. She denies vomiting and fever. She also reports an episode of syncope about 4 days ago after feeling  lightheaded while standing up. She explains that she gets the CP after she is on her feet for a long time. She states that taking deep breaths make it better but when she goes to sleep it is as if "her heart skips a beat". She reports that her father has HTN.  No alleviating factors. She went to [facility] in December for similar symptoms and they could not figure it out.   She also reports intermittent 7 diarrheal episodes a day for about 1 week and a half. She denies blood in her stool. She denies any recent travel or recently being on any antibiotics. She states that she feels like she may have a hemmorhoid. She reports loss of appetite and adds that she has to smoke marijuana about twice a week to eat.     Past Medical History  Diagnosis Date  . Boil of buttock   . Pneumonia    Past Surgical History  Procedure Laterality Date  . Cesarean section    . Wisdom tooth extraction     No family history on file. Social History  Substance Use Topics  . Smoking status: Current Every Day Smoker    Types: Cigarettes  . Smokeless tobacco: Never Used  . Alcohol Use: Yes     Comment: drinks socially   OB History  No data available     Review of Systems  Constitutional: Negative for fever.  Respiratory: Positive for cough and shortness of breath.   Cardiovascular: Positive for chest pain and syncope.  Gastrointestinal: Positive for nausea. Negative for vomiting.  Neurological: Positive for numbness.  All other systems reviewed and are negative.     Allergies  Latex  Home Medications   Prior to Admission medications   Not on File   BP 110/71 mmHg  Pulse 70  Temp(Src) 97.9 F (36.6 C) (Oral)  Resp 18  Ht  (1.778 m)  Wt 279 lb 14.4 oz (126.962 kg)  BMI 40.16 kg/m2  SpO2 100%  LMP 05/28/2015 Physical Exam  Constitutional: She is oriented to person, place, and time. She appears well-developed and well-nourished. No distress.  HENT:  Head: Normocephalic and  atraumatic.  Mouth/Throat: Oropharynx is clear and moist.  Eyes: EOM are normal. Pupils are equal, round, and reactive to light.  Neck: Normal range of motion. Neck supple.  Cardiovascular: Normal rate and regular rhythm.  Exam reveals no friction rub.   No murmur heard. Pulmonary/Chest: Effort normal and breath sounds normal. No respiratory distress. She has no wheezes. She has no rales.  Abdominal: Soft. She exhibits no distension. There is no tenderness. There is no rebound.  Musculoskeletal: Normal range of motion. She exhibits no edema.  Neurological: She is alert and oriented to person, place, and time.  Skin: She is not diaphoretic.  Nursing note and vitals reviewed.   ED Course  Procedures (including critical care time) DIAGNOSTIC STUDIES: Oxygen Saturation is 100% on RA, normal by my interpretation.    COORDINATION OF CARE: 7:13 PM- Pt advised of plan for treatment and pt agrees.    Labs Review Labs Reviewed - No data to display  Imaging Review Dg Chest 2 View  06/22/2015   CLINICAL DATA:  24 year old female with history of chest pain for the past 3 weeks. Diarrhea and nausea for 1 week. Cough with phlegm production. Shortness breath for the past 3 weeks.  EXAM: CHEST  2 VIEW  COMPARISON:  Multiple prior chest x-rays, most recently 07/20/2014.  FINDINGS: Lung volumes are normal. No consolidative airspace disease. No pleural effusions. No pneumothorax. No pulmonary nodule or mass noted. Pulmonary vasculature and the cardiomediastinal silhouette are within normal limits.  IMPRESSION: No radiographic evidence of acute cardiopulmonary disease.   Electronically Signed   By: Trudie Reed M.D.   On: 06/22/2015 19:57   I have personally reviewed and evaluated these images and lab results as part of my medical decision-making.   EKG Interpretation   Date/Time:  Friday June 22 2015 19:00:36 EDT Ventricular Rate:  58 PR Interval:  176 QRS Duration: 94 QT Interval:   404 QTC Calculation: 396 R Axis:   54 Text Interpretation:  Sinus bradycardia with sinus arrhythmia Otherwise  normal ECG ED PHYSICIAN INTERPRETATION AVAILABLE IN [facility]  Confirmed by TEST, Record ([id number]) on 06/23/2015 11:51:10 AM      MDM   Final diagnoses:  UTI (lower urinary tract infection)  Chest pain, unspecified chest pain type    72F here with URI symptoms, cough, general malaise. No abdominal pain or vomiting. Having atypical chest pain for 3 weeks. Workup shows UTI. Normal EKG. Stable for discharge, given antibiotics for her UTI.  I personally performed the services described in this documentation, which was scribed in my presence. The recorded information has been reviewed and is accurate.     Elwin Mocha, MD  06/24/15 1731 

## 2015-06-24 LAB — HIV ANTIBODY (ROUTINE TESTING W REFLEX): HIV SCREEN 4TH GENERATION: NONREACTIVE

## 2016-06-25 ENCOUNTER — Emergency Department (HOSPITAL_BASED_OUTPATIENT_CLINIC_OR_DEPARTMENT_OTHER)
Admission: EM | Admit: 2016-06-25 | Discharge: 2016-06-25 | Disposition: A | Discharge status: Home / Self Care | Payer: Medicaid Other | Attending: Emergency Medicine | Admitting: Emergency Medicine

## 2016-06-25 ENCOUNTER — Emergency Department (HOSPITAL_BASED_OUTPATIENT_CLINIC_OR_DEPARTMENT_OTHER): Payer: Medicaid Other

## 2016-06-25 ENCOUNTER — Encounter (HOSPITAL_BASED_OUTPATIENT_CLINIC_OR_DEPARTMENT_OTHER): Payer: Self-pay

## 2016-06-25 DIAGNOSIS — Z3A00 Weeks of gestation of pregnancy not specified: Secondary | ICD-10-CM | POA: Diagnosis not present

## 2016-06-25 DIAGNOSIS — Z3490 Encounter for supervision of normal pregnancy, unspecified, unspecified trimester: Secondary | ICD-10-CM

## 2016-06-25 DIAGNOSIS — O99519 Diseases of the respiratory system complicating pregnancy, unspecified trimester: Secondary | ICD-10-CM | POA: Insufficient documentation

## 2016-06-25 DIAGNOSIS — J019 Acute sinusitis, unspecified: Secondary | ICD-10-CM | POA: Diagnosis not present

## 2016-06-25 DIAGNOSIS — F17210 Nicotine dependence, cigarettes, uncomplicated: Secondary | ICD-10-CM | POA: Insufficient documentation

## 2016-06-25 DIAGNOSIS — O99330 Smoking (tobacco) complicating pregnancy, unspecified trimester: Secondary | ICD-10-CM | POA: Diagnosis not present

## 2016-06-25 LAB — URINALYSIS, ROUTINE W REFLEX MICROSCOPIC
Bilirubin Urine: NEGATIVE
GLUCOSE, UA: NEGATIVE mg/dL
Hgb urine dipstick: NEGATIVE
KETONES UR: 15 mg/dL — AB
NITRITE: NEGATIVE
PROTEIN: NEGATIVE mg/dL
Specific Gravity, Urine: 1.021 (ref 1.005–1.030)
pH: 6 (ref 5.0–8.0)

## 2016-06-25 LAB — URINE MICROSCOPIC-ADD ON

## 2016-06-25 LAB — PREGNANCY, URINE: PREG TEST UR: POSITIVE — AB

## 2016-06-25 MED ORDER — AMOXICILLIN 500 MG PO CAPS: 500.0000 mg | ORAL_CAPSULE | Freq: Three times a day (TID) | ORAL | 0 refills | Status: DC

## 2016-06-25 NOTE — ED Notes (Signed)
Patient transported to X-ray 

## 2016-06-25 NOTE — ED Notes (Signed)
MD at bedside. 

## 2016-06-25 NOTE — Discharge Instructions (Signed)
Amoxicillin as prescribed.  Follow-up with your obstetrician to establish prenatal care.

## 2016-06-25 NOTE — ED Provider Notes (Signed)
[facility]   CSN: [id number] Arrival date & time: 06/25/16  1306     History   Chief Complaint Chief Complaint  Patient presents with  . Cough    HPI Sierra Kirk is a 25 y.o. female.  Patient is a 25 year old female who presents with a two-week history of chest congestion, intermittently productive cough, and shortness of breath worsening over the past 2 weeks. She denies any fevers or chills. She denies any vomiting or diarrhea.   The history is provided by the patient.  Cough  This is a new problem. Episode onset: 2 weeks ago. The problem occurs constantly. The problem has been gradually worsening. The cough is productive of sputum. There has been no fever. Pertinent negatives include no chills. She has tried nothing for the symptoms. She is a smoker.    Past Medical History:  Diagnosis Date  . Boil of buttock   . Pneumonia     There are no active problems to display for this patient.   Past Surgical History:  Procedure Laterality Date  . CESAREAN SECTION    . WISDOM TOOTH EXTRACTION      OB History    No data available       Home Medications    Prior to Admission medications   Not on File    Family History No family history on file.  Social History Social History  Substance Use Topics  . Smoking status: Current Every Day Smoker    Types: Cigarettes  . Smokeless tobacco: Never Used  . Alcohol use No     Allergies   Latex   Review of Systems Review of Systems  Constitutional: Negative for chills.  Respiratory: Positive for cough.   All other systems reviewed and are negative.    Physical Exam Updated Vital Signs BP 102/65 (BP Location: Left Arm)   Pulse 85   Temp 98.9 F (37.2 C) (Oral)   Resp 18   Ht 5\' 10"  (1.778 m)   Wt 272 lb (123.4 kg)   LMP  (LMP Unknown)   BMI 39.03 kg/m   Physical Exam  Constitutional: She is oriented to person, place, and time. She appears well-developed and  well-nourished. No distress.  HENT:  Head: Normocephalic and atraumatic.  Mouth/Throat: Oropharynx is clear and moist.  Neck: Normal range of motion. Neck supple.  Cardiovascular: Normal rate and regular rhythm.  Exam reveals no gallop and no friction rub.   No murmur heard. Pulmonary/Chest: Effort normal and breath sounds normal. No respiratory distress. She has no wheezes.  Abdominal: Soft. Bowel sounds are normal. She exhibits no distension. There is no tenderness.  Musculoskeletal: Normal range of motion.  Neurological: She is alert and oriented to person, place, and time.  Skin: Skin is warm and dry. She is not diaphoretic.  Nursing note and vitals reviewed.    ED Treatments / Results  Labs (all labs ordered are listed, but only abnormal results are displayed) Labs Reviewed  PREGNANCY, URINE  URINALYSIS, ROUTINE W REFLEX MICROSCOPIC (NOT AT [facility])    EKG  EKG Interpretation None       Radiology No results found.  Procedures Procedures (including critical care time)  Medications Ordered in ED Medications - No data to display   Initial Impression / Assessment and Plan / ED Course  I have reviewed the triage vital signs and the nursing notes.  Pertinent labs & imaging results that were available during my care of the patient were  reviewed by me and considered in my medical decision making (see chart for details).  Clinical Course    Patient presents with complaints of chest congestion and cough that has been persistent for the past 2 weeks. She denies any fevers or chills or chest pain. Her chest x-ray does not reveal an infiltrate or other acute process. Her urinalysis is clear, however urine pregnancy test is positive. She is having no abdominal pain, bleeding, or female issues and I do not feel as though this requires any emergent workup. She is to follow-up with her OB in the next week.  She will also be given Amoxicillin for what appears to be a sinus  infection.  Final Clinical Impressions(s) / ED Diagnoses   Final diagnoses:  None    New Prescriptions New Prescriptions   No medications on file     Geoffery Lyons, MD 06/25/16 1455

## 2016-06-25 NOTE — ED Triage Notes (Signed)
Co prod cough, head/chest congestion x 2 weeks-NAD-steady gait

## 2016-08-02 ENCOUNTER — Emergency Department (HOSPITAL_BASED_OUTPATIENT_CLINIC_OR_DEPARTMENT_OTHER): Payer: Medicaid Other

## 2016-08-02 ENCOUNTER — Emergency Department (HOSPITAL_BASED_OUTPATIENT_CLINIC_OR_DEPARTMENT_OTHER)
Admission: EM | Admit: 2016-08-02 | Discharge: 2016-08-02 | Disposition: A | Discharge status: Home / Self Care | Payer: Medicaid Other | Attending: Emergency Medicine | Admitting: Emergency Medicine

## 2016-08-02 ENCOUNTER — Encounter (HOSPITAL_BASED_OUTPATIENT_CLINIC_OR_DEPARTMENT_OTHER): Payer: Self-pay | Admitting: Emergency Medicine

## 2016-08-02 DIAGNOSIS — O26891 Other specified pregnancy related conditions, first trimester: Secondary | ICD-10-CM | POA: Insufficient documentation

## 2016-08-02 DIAGNOSIS — R197 Diarrhea, unspecified: Secondary | ICD-10-CM | POA: Insufficient documentation

## 2016-08-02 DIAGNOSIS — R1011 Right upper quadrant pain: Secondary | ICD-10-CM | POA: Diagnosis not present

## 2016-08-02 DIAGNOSIS — F17210 Nicotine dependence, cigarettes, uncomplicated: Secondary | ICD-10-CM | POA: Insufficient documentation

## 2016-08-02 DIAGNOSIS — O9989 Other specified diseases and conditions complicating pregnancy, childbirth and the puerperium: Secondary | ICD-10-CM | POA: Insufficient documentation

## 2016-08-02 LAB — COMPREHENSIVE METABOLIC PANEL
ALBUMIN: 3.1 g/dL — AB (ref 3.5–5.0)
ALK PHOS: 38 U/L (ref 38–126)
ALT: 15 U/L (ref 14–54)
AST: 18 U/L (ref 15–41)
Anion gap: 7 (ref 5–15)
BUN: 7 mg/dL (ref 6–20)
CALCIUM: 8.7 mg/dL — AB (ref 8.9–10.3)
CO2: 23 mmol/L (ref 22–32)
CREATININE: 0.55 mg/dL (ref 0.44–1.00)
Chloride: 104 mmol/L (ref 101–111)
GFR calc Af Amer: >60 mL/min (ref >60)
GFR calc non Af Amer: >60 mL/min (ref >60)
GLUCOSE: 64 mg/dL — AB (ref 65–99)
Potassium: 3.3 mmol/L — ABNORMAL LOW (ref 3.5–5.1)
SODIUM: 134 mmol/L — AB (ref 135–145)
Total Bilirubin: 0.3 mg/dL (ref 0.3–1.2)
Total Protein: 7.3 g/dL (ref 6.5–8.1)

## 2016-08-02 LAB — CBC WITH DIFFERENTIAL/PLATELET
BASOS ABS: 0 10*3/uL (ref 0.0–0.1)
BASOS PCT: 0 %
Eosinophils Absolute: 0.1 10*3/uL (ref 0.0–0.7)
Eosinophils Relative: 1 %
HCT: 28.9 % — ABNORMAL LOW (ref 36.0–46.0)
HEMOGLOBIN: 9.7 g/dL — AB (ref 12.0–15.0)
Lymphocytes Relative: 14 %
Lymphs Abs: 1.6 10*3/uL (ref 0.7–4.0)
MCH: 22.6 pg — ABNORMAL LOW (ref 26.0–34.0)
MCHC: 33.6 g/dL (ref 30.0–36.0)
MCV: 67.4 fL — ABNORMAL LOW (ref 78.0–100.0)
MONOS PCT: 9 %
Monocytes Absolute: 1.1 10*3/uL — ABNORMAL HIGH (ref 0.1–1.0)
NEUTROS PCT: 76 %
Neutro Abs: 8.9 10*3/uL — ABNORMAL HIGH (ref 1.7–7.7)
Platelets: 253 10*3/uL (ref 150–400)
RBC: 4.29 MIL/uL (ref 3.87–5.11)
RDW: 15.1 % (ref 11.5–15.5)
WBC: 11.7 10*3/uL — ABNORMAL HIGH (ref 4.0–10.5)

## 2016-08-02 LAB — URINALYSIS, ROUTINE W REFLEX MICROSCOPIC
Bilirubin Urine: NEGATIVE
GLUCOSE, UA: NEGATIVE mg/dL
Nitrite: NEGATIVE
PH: 6 (ref 5.0–8.0)
Protein, ur: 30 mg/dL — AB
Specific Gravity, Urine: 1.022 (ref 1.005–1.030)

## 2016-08-02 LAB — URINE MICROSCOPIC-ADD ON

## 2016-08-02 LAB — LIPASE, BLOOD: Lipase: 14 U/L (ref 11–51)

## 2016-08-02 MED ORDER — SODIUM CHLORIDE 0.9 % IV BOLUS (SEPSIS)
1000.0000 mL | Freq: Once | INTRAVENOUS | Status: AC
  Administered 2016-08-02: 1000 mL via INTRAVENOUS

## 2016-08-02 MED ORDER — FOSFOMYCIN TROMETHAMINE 3 G PO PACK
3.0000 g | PACK | Freq: Once | ORAL | Status: AC
  Administered 2016-08-02: 3 g via ORAL
  Filled 2016-08-02: qty 3

## 2016-08-02 NOTE — ED Triage Notes (Signed)
Pt c/o upper abd pain and also pain between vagina and rectum x 4 days. Pt is 4 months pregnant. Pt denies vaginal discharge or bleeding.

## 2016-08-02 NOTE — ED Notes (Signed)
Patient transported to Ultrasound 

## 2016-08-02 NOTE — Discharge Instructions (Signed)
Follow up with your OB, take tylenol as needed.

## 2016-08-02 NOTE — ED Provider Notes (Signed)
[facility] Provider Note   CSN: [id number] Arrival date & time: 08/02/16  1404  By signing my name below, I, Rosario Adie, attest that this documentation has been prepared under the direction and in the presence of Melene Plan, DO. Electronically Signed: Rosario Adie, ED Scribe. 08/02/16. 3:16 PM.  History   Chief Complaint Chief Complaint  Patient presents with  . Abdominal Pain   The history is provided by the patient. No language interpreter was used.  Abdominal Pain   This is a new problem. The current episode started more than 2 days ago. The problem occurs constantly. The problem has been gradually worsening. Associated with: pregnancy. The pain is located in the RUQ and LUQ. The pain is at a severity of 8/10. The pain is moderate. Associated symptoms include diarrhea. Pertinent negatives include fever, nausea, vomiting, dysuria, headaches, arthralgias and myalgias. The symptoms are aggravated by bowel movements and coughing. Nothing relieves the symptoms.   HPI Comments: Sierra Kirk is a 25 y.o. female who is ~4 months pregnant, with no pertinent PMHx, who presents to the Emergency Department complaining of gradually worsening, constant upper abdominal pain onset ~3 days ago. She states that her pain is worse more on her right side than her left. Pt reports associated perineal pain and intermittent episodes of diarrhea secondary to the onset of her abdominal pain. She states that bowel movements and coughing will exacerbated her abdominal pain. No treatments were tried for her pain prior to coming into the ED. She has had decreased PO intake since the onset of her abdominal pain. Pt is currently followed by an OB/GYN for her prenatal care and she was last seen for f/u ~10 days PTA. Pt was also [facility] and she was started on Clindamycin at that time for a recurrent boil to her buttock. Pt reports a hx of recurrent UTIs. She denies vaginal discharge,  vaginal bleeding, fever, or any other associated symptoms.    Past Medical History:  Diagnosis Date  . Boil of buttock   . Pneumonia    There are no active problems to display for this patient.  Past Surgical History:  Procedure Laterality Date  . CESAREAN SECTION    . WISDOM TOOTH EXTRACTION     OB History    Gravida Para Term Preterm AB Living   1             SAB TAB Ectopic Multiple Live Births                 Home Medications    Prior to Admission medications   Medication Sig Start Date End Date Taking? Authorizing Provider  amoxicillin (AMOXIL) 500 MG capsule Take 1 capsule (500 mg total) by mouth 3 (three) times daily. 06/25/16   Geoffery Lyons, MD   Family History No family history on file.  Social History Social History  Substance Use Topics  . Smoking status: Current Every Day Smoker    Types: Cigarettes  . Smokeless tobacco: Never Used  . Alcohol use No   Allergies   Latex  Review of Systems Review of Systems  Constitutional: Negative for chills and fever.  HENT: Negative for congestion and rhinorrhea.   Eyes: Negative for redness and visual disturbance.  Respiratory: Negative for shortness of breath and wheezing.   Cardiovascular: Negative for chest pain and palpitations.  Gastrointestinal: Positive for abdominal pain (upper) and diarrhea. Negative for nausea and vomiting.  Genitourinary: Negative for dysuria, urgency,  vaginal bleeding and vaginal discharge.  Musculoskeletal: Negative for arthralgias and myalgias.  Skin: Negative for pallor and wound.  Neurological: Negative for dizziness and headaches.  All other systems reviewed and are negative.  Physical Exam Updated Vital Signs BP 117/72 (BP Location: Left Arm)   Pulse 75   Temp 98.3 F (36.8 C) (Oral)   Resp 16   Ht 5\' 10"  (1.778 m)   Wt 272 lb (123.4 kg)   LMP 05/02/2016   SpO2 100%   BMI 39.03 kg/m   Physical Exam  Constitutional: She is oriented to person, place, and time. She  appears well-developed and well-nourished. No distress.  HENT:  Head: Normocephalic and atraumatic.  Eyes: EOM are normal. Pupils are equal, round, and reactive to light.  Neck: Normal range of motion. Neck supple.  Cardiovascular: Normal rate and regular rhythm.  Exam reveals no gallop and no friction rub.   No murmur heard. Pulmonary/Chest: Effort normal. She has no wheezes. She has no rales.  Abdominal: Soft. There is tenderness.  Fundus is 3-4 finger breadths above the umbilicus. Tenderness of the right upper chest wall. Mild RUQ tenderness. Negative Murphy's sign.   Musculoskeletal: She exhibits no edema or tenderness.  Neurological: She is alert and oriented to person, place, and time.  Skin: Skin is warm and dry. She is not diaphoretic.  Psychiatric: She has a normal mood and affect. Her behavior is normal.  Nursing note and vitals reviewed.  ED Treatments / Results  DIAGNOSTIC STUDIES: Oxygen Saturation is 100% on RA, normal by my interpretation.   COORDINATION OF CARE: 3:14 PM-Discussed next steps with pt. Pt verbalized understanding and is agreeable with the plan.   Labs (all labs ordered are listed, but only abnormal results are displayed) Labs Reviewed  URINALYSIS, ROUTINE W REFLEX MICROSCOPIC (NOT AT [facility]) - Abnormal; Notable for the following:       Result Value   APPearance TURBID (*)    Hgb urine dipstick MODERATE (*)    Ketones, ur >80 (*)    Protein, ur 30 (*)    Leukocytes, UA LARGE (*)    All other components within normal limits  URINE MICROSCOPIC-ADD ON - Abnormal; Notable for the following:    Squamous Epithelial / LPF 0-5 (*)    Bacteria, UA MANY (*)    All other components within normal limits  CBC WITH DIFFERENTIAL/PLATELET - Abnormal; Notable for the following:    WBC 11.7 (*)    Hemoglobin 9.7 (*)    HCT 28.9 (*)    MCV 67.4 (*)    MCH 22.6 (*)    Neutro Abs 8.9 (*)    Monocytes Absolute 1.1 (*)    All other components within normal limits    COMPREHENSIVE METABOLIC PANEL - Abnormal; Notable for the following:    Sodium 134 (*)    Potassium 3.3 (*)    Glucose, Bld 64 (*)    Calcium 8.7 (*)    Albumin 3.1 (*)    All other components within normal limits  LIPASE, BLOOD   EKG  EKG Interpretation None      Radiology US Abdomen Limited Ruq  Result Date: 08/02/2016 CLINICAL DATA:  Right upper quadrant pain for 2 days. EXAM: US ABDOMEN LIMITED - RIGHT UPPER QUADRANT COMPARISON:  None. FINDINGS: Gallbladder: No gallstones or wall thickening visualized. No sonographic Murphy sign noted by sonographer. Common bile duct: Diameter: 3.8 mm Liver: No focal lesion identified. Within normal limits in parenchymal echogenicity. IMPRESSION: Normal study.  Electronically Signed   By: Gerome Samavid  Williams III M.D   On: 08/02/2016 15:46    Procedures Procedures   Medications Ordered in ED Medications  sodium chloride 0.9 % bolus 1,000 mL (0 mLs Intravenous Stopped 08/02/16 1647)  fosfomycin (MONUROL) packet 3 g (3 g Oral Given 08/02/16 1646)    Initial Impression / Assessment and Plan / ED Course  I have reviewed the triage vital signs and the nursing notes.  Pertinent labs & imaging results that were available during my care of the patient were reviewed by me and considered in my medical decision making (see chart for details).  Clinical Course   25 yo F With a chief complaint of right upper quadrant abdominal pain. This been going on for months and months. Patient has been seen by multiple providers for the same. States she has gotten x-rays and nobody is here found in etiology. On my exam patient has some mild tenderness to the right chest wall as well as the right upper quadrant. Right upper quadrant ultrasound was negative. Patient does have a urinary tract infection and is pregnant will treat with fosfomycin. PCP follow-up.  5:43 PM:  I have discussed the diagnosis/risks/treatment options with the patient and believe the pt to be  eligible for discharge home to follow-up with OB. We also discussed returning to the ED immediately if new or worsening sx occur. We discussed the sx which are most concerning (e.g., sudden worsening pain, fever, inability to tolerate by mouth) that necessitate immediate return. Medications administered to the patient during their visit and any new prescriptions provided to the patient are listed below.  Medications given during this visit Medications  sodium chloride 0.9 % bolus 1,000 mL (0 mLs Intravenous Stopped 08/02/16 1647)  fosfomycin (MONUROL) packet 3 g (3 g Oral Given 08/02/16 1646)     The patient appears reasonably screen and/or stabilized for discharge and I doubt any other medical condition or other [facility] requiring further screening, evaluation, or treatment in the ED at this time prior to discharge.   Final Clinical Impressions(s) / ED Diagnoses   Final diagnoses:  RUQ abdominal pain   New Prescriptions Discharge Medication List as of 08/02/2016  4:40 PM     I personally performed the services described in this documentation, which was scribed in my presence. The recorded information has been reviewed and is accurate.     Melene Planan Iolanda Folson, DO 08/02/16 1743

## 2016-09-05 ENCOUNTER — Emergency Department (HOSPITAL_BASED_OUTPATIENT_CLINIC_OR_DEPARTMENT_OTHER)
Admission: EM | Admit: 2016-09-05 | Discharge: 2016-09-05 | Disposition: A | Discharge status: Home / Self Care | Payer: Medicaid Other | Attending: Emergency Medicine | Admitting: Emergency Medicine

## 2016-09-05 ENCOUNTER — Emergency Department (HOSPITAL_BASED_OUTPATIENT_CLINIC_OR_DEPARTMENT_OTHER): Payer: Medicaid Other

## 2016-09-05 ENCOUNTER — Encounter (HOSPITAL_BASED_OUTPATIENT_CLINIC_OR_DEPARTMENT_OTHER): Payer: Self-pay | Admitting: *Deleted

## 2016-09-05 DIAGNOSIS — Z91040 Latex allergy status: Secondary | ICD-10-CM | POA: Insufficient documentation

## 2016-09-05 DIAGNOSIS — B9689 Other specified bacterial agents as the cause of diseases classified elsewhere: Secondary | ICD-10-CM

## 2016-09-05 DIAGNOSIS — N899 Noninflammatory disorder of vagina, unspecified: Secondary | ICD-10-CM | POA: Diagnosis present

## 2016-09-05 DIAGNOSIS — N760 Acute vaginitis: Secondary | ICD-10-CM | POA: Diagnosis not present

## 2016-09-05 DIAGNOSIS — F17210 Nicotine dependence, cigarettes, uncomplicated: Secondary | ICD-10-CM | POA: Diagnosis not present

## 2016-09-05 DIAGNOSIS — N390 Urinary tract infection, site not specified: Secondary | ICD-10-CM

## 2016-09-05 LAB — WET PREP, GENITAL
SPERM: NONE SEEN
TRICH WET PREP: NONE SEEN
YEAST WET PREP: NONE SEEN

## 2016-09-05 LAB — URINALYSIS, ROUTINE W REFLEX MICROSCOPIC
BILIRUBIN URINE: NEGATIVE
Glucose, UA: NEGATIVE mg/dL
KETONES UR: 15 mg/dL — AB
NITRITE: NEGATIVE
SPECIFIC GRAVITY, URINE: 1.028 (ref 1.005–1.030)
pH: 6 (ref 5.0–8.0)

## 2016-09-05 LAB — URINE MICROSCOPIC-ADD ON

## 2016-09-05 MED ORDER — CEPHALEXIN 500 MG PO CAPS: 500.0000 mg | ORAL_CAPSULE | Freq: Two times a day (BID) | ORAL | 0 refills | Status: DC

## 2016-09-05 MED ORDER — METRONIDAZOLE 500 MG PO TABS: 500.0000 mg | ORAL_TABLET | Freq: Two times a day (BID) | ORAL | 0 refills | Status: DC

## 2016-09-05 NOTE — Progress Notes (Signed)
Spoke with Dr Debroah LoopArnold in regards to pt status, He reviewed fetal tracing. Pt OB cleared and may come off monitor.

## 2016-09-05 NOTE — Progress Notes (Signed)
Received from call from [facility] [place], pt is G2 P1 24.[redacted] weeks pregnant, who came in complaining of bloody mucus, vaginal pain and cramping. Pt gets care in [place]. Pelvic exam done, no pooling of fluid, discharge only, cultures done, UA done. Placed on fetal monitor.

## 2016-09-05 NOTE — ED Notes (Signed)
Pt given d/c instructions as per chart. Rx x 2. Verbalizes understanding. No questions. 

## 2016-09-05 NOTE — ED Notes (Signed)
Pt states she is approx [redacted] weeks pregnant. Noticed ?bloody mucous today along with some pain and cramping in her vagina. +nausea. No vomiting. Urine cloudy. Also describes frequency and dysuria. Placed on fetal monitor per protocol. [facility].

## 2016-09-05 NOTE — ED Notes (Signed)
ED Provider at bedside to discuss results. 

## 2016-09-05 NOTE — ED Triage Notes (Signed)
States she is [redacted] weeks pregnant. She is having bloody vaginal discharge this evening. Urgency to urinate and frequency. Vaginal pain.

## 2016-09-05 NOTE — ED Provider Notes (Signed)
[facility] Provider Note   CSN: [id number] Arrival date & time: 09/05/16  1637     History   Chief Complaint Chief Complaint  Patient presents with  . Vaginal Discharge    HPI Sierra Kirk is a 25 y.o. female.  Patient is a 25 year old female who is 24 weeks, 1 day pregnant with an EDC of March 8 who presents with vaginal discharge and lower abdominal cramping. She states today she's noted some bloody discharge and feels like she's leaking fluid. She also notes some lower abdominal cramping which she describes a sharp pains. She has some mild nausea but no vomiting. No prior problems with the pregnancy. She denies any fevers. No urinary symptoms. [facility] [place] [place] [facility].      Past Medical History:  Diagnosis Date  . Boil of buttock   . Pneumonia     There are no active problems to display for this patient.   Past Surgical History:  Procedure Laterality Date  . CESAREAN SECTION    . WISDOM TOOTH EXTRACTION      OB History    Gravida Para Term Preterm AB Living   1             SAB TAB Ectopic Multiple Live Births                   Home Medications    Prior to Admission medications   Medication Sig Start Date End Date Taking? Authorizing Provider  amoxicillin (AMOXIL) 500 MG capsule Take 1 capsule (500 mg total) by mouth 3 (three) times daily. 06/25/16   Geoffery Lyonsouglas Delo, MD  cephALEXin (KEFLEX) 500 MG capsule Take 1 capsule (500 mg total) by mouth 2 (two) times daily. 09/05/16   Rolan BuccoMelanie Bhargav Barbaro, MD    Family History No family history on file.  Social History Social History  Substance Use Topics  . Smoking status: Current Every Day Smoker    Types: Cigarettes  . Smokeless tobacco: Never Used  . Alcohol use No     Allergies   Latex   Review of Systems Review of Systems  Constitutional: Negative for chills, diaphoresis, fatigue and fever.  HENT: Negative for congestion, rhinorrhea and sneezing.   Eyes: Negative.     Respiratory: Negative for cough, chest tightness and shortness of breath.   Cardiovascular: Negative for chest pain and leg swelling.  Gastrointestinal: Positive for abdominal pain. Negative for blood in stool, diarrhea, nausea and vomiting.  Genitourinary: Positive for vaginal bleeding, vaginal discharge and vaginal pain. Negative for difficulty urinating, flank pain, frequency and hematuria.  Musculoskeletal: Negative for arthralgias and back pain.  Skin: Negative for rash.  Neurological: Negative for dizziness, speech difficulty, weakness, numbness and headaches.     Physical Exam Updated Vital Signs BP 110/70 (BP Location: Left Arm)   Pulse 80   Temp 98.4 F (36.9 C) (Oral)   Resp 18   Ht 5\' 10"  (1.778 m)   Wt 274 lb (124.3 kg)   LMP 05/02/2016 Comment: G2 P1  SpO2 100%   BMI 39.31 kg/m   Physical Exam  Constitutional: She is oriented to person, place, and time. She appears well-developed and well-nourished.  HENT:  Head: Normocephalic and atraumatic.  Eyes: Pupils are equal, round, and reactive to light.  Neck: Normal range of motion. Neck supple.  Cardiovascular: Normal rate, regular rhythm and normal heart sounds.   Pulmonary/Chest: Effort normal and breath sounds normal. No respiratory distress. She has no wheezes.  She has no rales. She exhibits no tenderness.  Abdominal: Soft. Bowel sounds are normal. There is tenderness (mild tenderness across lower abdomen). There is no rebound and no guarding.  Genitourinary:  Genitourinary Comments: Sterile speculum exam was performed. There is some white vaginal discharge. No bleeding is noted. The cervix is closed. No pooling of fluid is noted  Musculoskeletal: Normal range of motion. She exhibits no edema.  Lymphadenopathy:    She has no cervical adenopathy.  Neurological: She is alert and oriented to person, place, and time.  Skin: Skin is warm and dry. No rash noted.  Psychiatric: She has a normal mood and affect.      ED Treatments / Results  Labs (all labs ordered are listed, but only abnormal results are displayed) Labs Reviewed  WET PREP, GENITAL - Abnormal; Notable for the following:       Result Value   Clue Cells Wet Prep HPF POC PRESENT (*)    WBC, Wet Prep HPF POC MODERATE (*)    All other components within normal limits  URINALYSIS, ROUTINE W REFLEX MICROSCOPIC (NOT AT [facility]) - Abnormal; Notable for the following:    APPearance TURBID (*)    Hgb urine dipstick LARGE (*)    Ketones, ur 15 (*)    Protein, ur >300 (*)    Leukocytes, UA MODERATE (*)    All other components within normal limits  URINE MICROSCOPIC-ADD ON - Abnormal; Notable for the following:    Squamous Epithelial / LPF 6-30 (*)    Bacteria, UA FEW (*)    All other components within normal limits  GC/CHLAMYDIA PROBE AMP ([place]) NOT AT [facility]    EKG  EKG Interpretation None       Radiology [place] Ob Limited  Result Date: 09/05/2016 CLINICAL DATA:  Vaginal discharge. EXAM: LIMITED OBSTETRIC ULTRASOUND FINDINGS: Number of Fetuses: 1 Heart Rate:  143 bpm Movement: Yes Presentation: Cephalic Placental Location: Anterior Previa: No Amniotic Fluid (Subjective):  Within normal limits. BPD:  5.63cm 23w  1d MATERNAL FINDINGS: Cervix:  Appears closed. Uterus/Adnexae:  No abnormality visualized. IMPRESSION: Approximately [redacted] weeks gestation. Fetal heart rate 143 beats per minute. No acute maternal findings. This exam is performed on an emergent basis and does not comprehensively evaluate fetal size, dating, or anatomy; follow-up complete OB US should be considered if further fetal assessment is warranted. Electronically Signed   By: Charlett NoseKevin  Dover M.D.   On: 09/05/2016 18:20    Procedures Procedures (including critical care time)  Medications Ordered in ED Medications - No data to display   Initial Impression / Assessment and Plan / ED Course  I have reviewed the triage vital signs and the nursing notes.  Pertinent labs  & imaging results that were available during my care of the patient were reviewed by me and considered in my medical decision making (see chart for details).  Clinical Course     Patient was monitored on the fetal heart monitor. She had no evidence of contractions or concerning heart tracings. She doesn't have any bleeding on exam. Her cervix is closed. Her ultrasound shows a normal amount of fluid. She does have evidence of a UTI in her symptoms are most consistent with a UTI. She does have some slight vaginal discharge and clue cells on wet prep. She was treated with Flagyl and Keflex. She was encouraged to have close follow-up with her OB/GYN.  Final Clinical Impressions(s) / ED Diagnoses   Final diagnoses:  Lower urinary tract infectious  disease  BV (bacterial vaginosis)    New Prescriptions New Prescriptions   CEPHALEXIN (KEFLEX) 500 MG CAPSULE    Take 1 capsule (500 mg total) by mouth 2 (two) times daily.     Rolan Bucco, MD 09/05/16 [phone]

## 2016-09-05 NOTE — ED Notes (Signed)
US being done at bedside

## 2016-09-05 NOTE — ED Notes (Signed)
[facility] called and stated that we could remove the pt from the monitor.

## 2016-09-08 LAB — GC/CHLAMYDIA PROBE AMP (~~LOC~~) NOT AT ARMC
Chlamydia: NEGATIVE
NEISSERIA GONORRHEA: NEGATIVE

## 2017-07-11 ENCOUNTER — Encounter (HOSPITAL_COMMUNITY): Payer: Self-pay

## 2017-07-11 IMAGING — CR DG CHEST 2V
2 series · 2 of 2 positions shown · non-contrast
Comparison: 06/22/2015

CLINICAL DATA: Productive cough, headache for 3 weeks, smoker

EXAM:
CHEST  2 VIEW

[w chest pa]
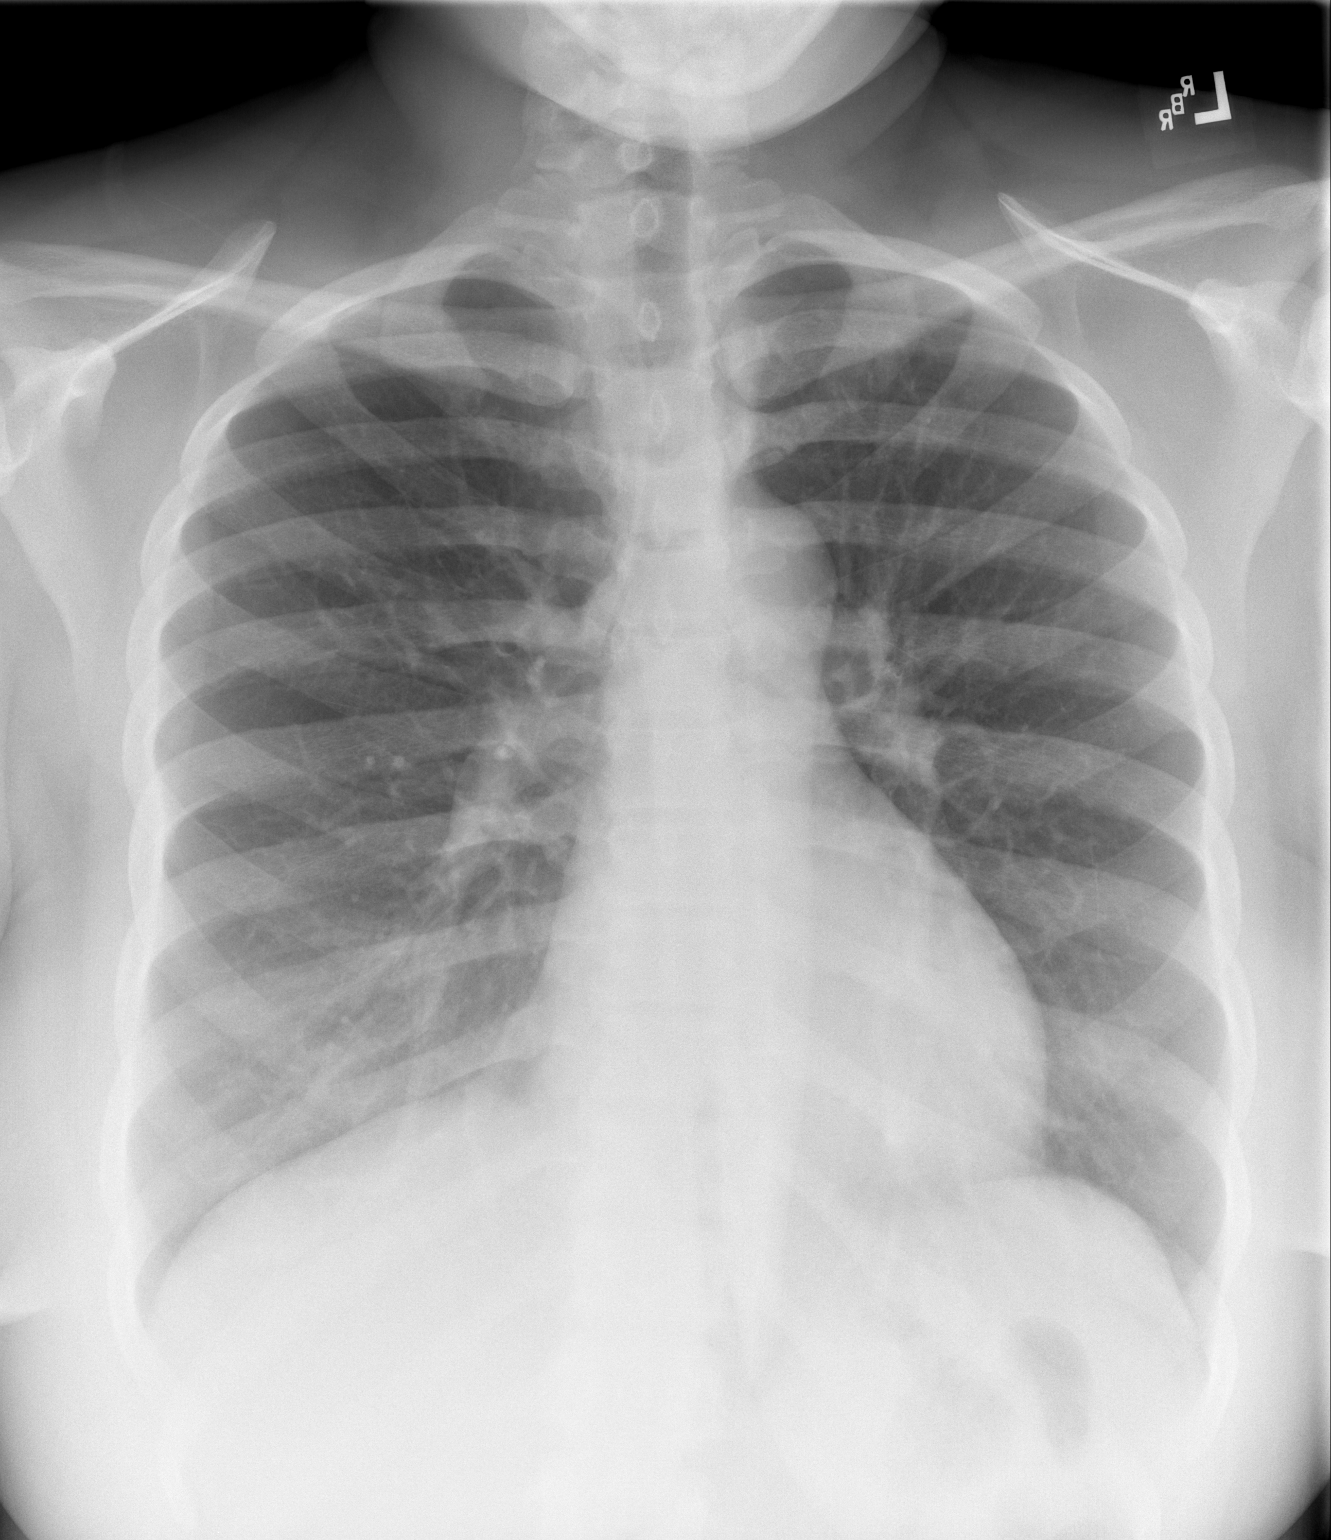

[w chest lat]
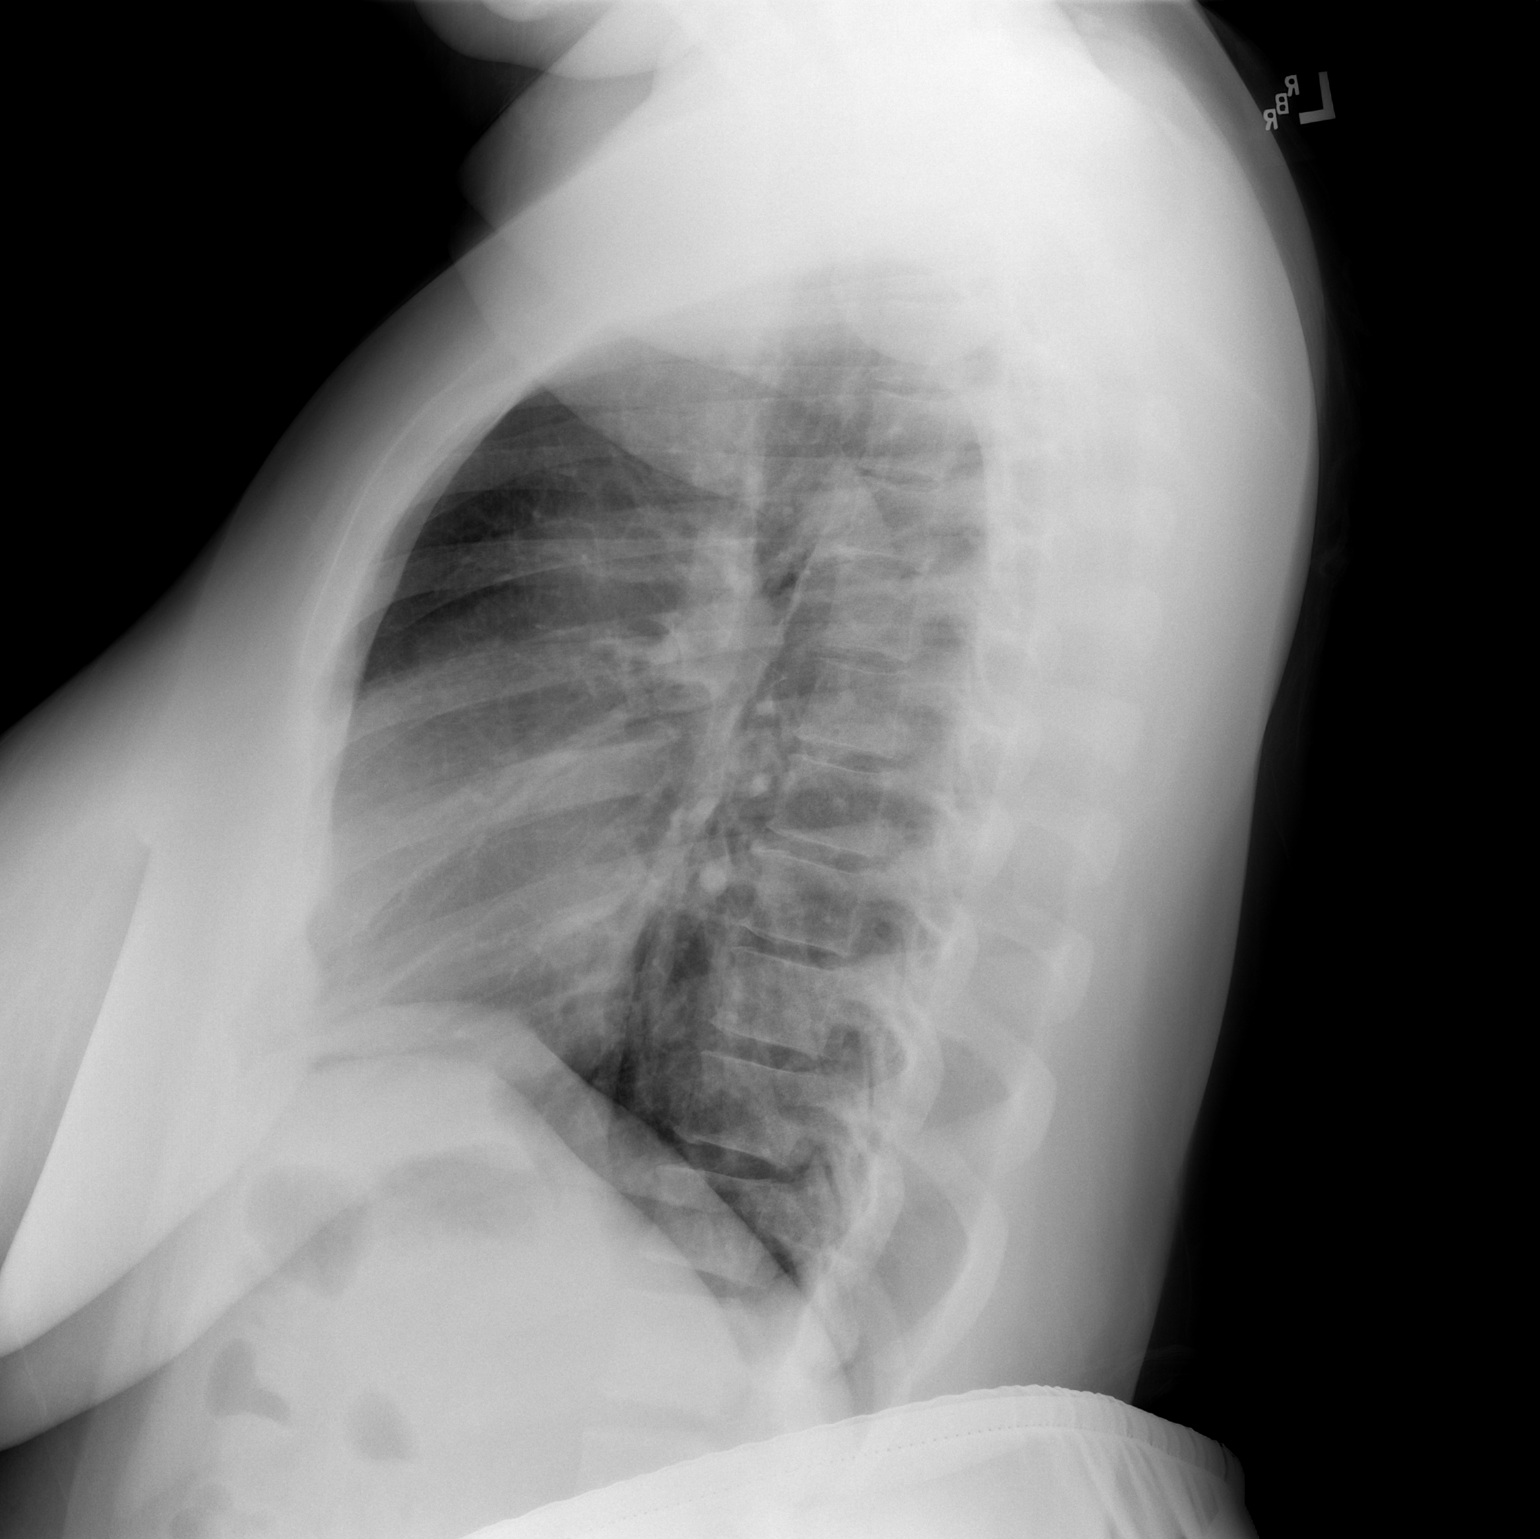

[2 of 2 positions shown; findings below may reference images not displayed]

FINDINGS: Normal heart size, mediastinal contours, and pulmonary vascularity.

Lungs clear.

No pleural effusion or pneumothorax.

Bones unremarkable.
IMPRESSION: Normal exam.

## 2020-05-18 ENCOUNTER — Encounter (HOSPITAL_BASED_OUTPATIENT_CLINIC_OR_DEPARTMENT_OTHER): Payer: Self-pay

## 2020-05-18 ENCOUNTER — Emergency Department (HOSPITAL_BASED_OUTPATIENT_CLINIC_OR_DEPARTMENT_OTHER)
Admission: EM | Admit: 2020-05-18 | Discharge: 2020-05-18 | Disposition: A | Discharge status: Home / Self Care | Payer: HRSA Program | Attending: Emergency Medicine | Admitting: Emergency Medicine

## 2020-05-18 ENCOUNTER — Other Ambulatory Visit: Payer: Self-pay

## 2020-05-18 DIAGNOSIS — Z5321 Procedure and treatment not carried out due to patient leaving prior to being seen by health care provider: Secondary | ICD-10-CM | POA: Diagnosis not present

## 2020-05-18 DIAGNOSIS — M791 Myalgia, unspecified site: Secondary | ICD-10-CM | POA: Diagnosis present

## 2020-05-18 DIAGNOSIS — U071 COVID-19: Secondary | ICD-10-CM | POA: Insufficient documentation

## 2020-05-18 LAB — URINALYSIS, ROUTINE W REFLEX MICROSCOPIC
Bilirubin Urine: NEGATIVE
Glucose, UA: NEGATIVE mg/dL
Hgb urine dipstick: NEGATIVE
Ketones, ur: NEGATIVE mg/dL
Nitrite: POSITIVE — AB
Protein, ur: NEGATIVE mg/dL
Specific Gravity, Urine: 1.02 (ref 1.005–1.030)
pH: 7 (ref 5.0–8.0)

## 2020-05-18 LAB — PREGNANCY, URINE: Preg Test, Ur: NEGATIVE

## 2020-05-18 LAB — URINALYSIS, MICROSCOPIC (REFLEX)

## 2020-05-18 LAB — SARS CORONAVIRUS 2 BY RT PCR (HOSPITAL ORDER, PERFORMED IN ~~LOC~~ HOSPITAL LAB): SARS Coronavirus 2: POSITIVE — AB

## 2020-05-18 MED ORDER — ONDANSETRON 4 MG PO TBDP
4.0000 mg | ORAL_TABLET | Freq: Once | ORAL | Status: AC
  Administered 2020-05-18: 4 mg via ORAL
  Filled 2020-05-18: qty 1

## 2020-05-18 MED ORDER — ACETAMINOPHEN 325 MG PO TABS
650.0000 mg | ORAL_TABLET | Freq: Once | ORAL | Status: AC
  Administered 2020-05-18: 650 mg via ORAL
  Filled 2020-05-18: qty 2

## 2020-05-18 NOTE — ED Provider Notes (Signed)
Patient left without being seen before I could see her today.  Patient had screening labs in triage performed and was found to have COVID-19.  She also was found of UTI but left before she could be given prescription for antibiotics or given instructions on management.  If she returns, anticipate giving her prescription for antibiotics and instructions to self isolate with Covid.  She can follow-up on the urinalysis via MyChart and her PCP.  Nursing reports that patient acknowledged that she had Covid and was leaving to go home.  Patient eloped and left without being seen.   Kynnedy Carreno, Canary Brim, MD 05/18/20 1539

## 2020-05-18 NOTE — ED Triage Notes (Signed)
Pt c/o flu like sx x 3 days with +covid exposure-no covid vaccine-NAD-steady gait

## 2020-05-19 ENCOUNTER — Telehealth (HOSPITAL_COMMUNITY): Payer: Self-pay | Admitting: Nurse Practitioner

## 2020-05-19 DIAGNOSIS — U071 COVID-19: Secondary | ICD-10-CM

## 2020-05-19 NOTE — Telephone Encounter (Signed)
Called to Discuss with patient about Covid symptoms and the use of regeneron, a monoclonal antibody infusion for those with mild to moderate Covid symptoms and at a high risk of hospitalization.     Pt is qualified for this infusion at the [facility] infusion center due to co-morbid conditions and/or a member of an at-risk group.     Unable to reach pt. Left message to return call  Ellard Nan, DNP, AGNP-C [phone] (Infusion Center Hotline)  

## 2023-01-29 ENCOUNTER — Encounter (HOSPITAL_BASED_OUTPATIENT_CLINIC_OR_DEPARTMENT_OTHER): Payer: Self-pay | Admitting: Emergency Medicine

## 2023-01-29 ENCOUNTER — Emergency Department (HOSPITAL_BASED_OUTPATIENT_CLINIC_OR_DEPARTMENT_OTHER)
Admission: EM | Admit: 2023-01-29 | Discharge: 2023-01-30 | Disposition: A | Discharge status: Home / Self Care | Payer: 59 | Attending: Emergency Medicine | Admitting: Emergency Medicine

## 2023-01-29 ENCOUNTER — Other Ambulatory Visit: Payer: Self-pay

## 2023-01-29 DIAGNOSIS — L03317 Cellulitis of buttock: Secondary | ICD-10-CM | POA: Diagnosis not present

## 2023-01-29 DIAGNOSIS — L0291 Cutaneous abscess, unspecified: Secondary | ICD-10-CM

## 2023-01-29 DIAGNOSIS — Z91040 Latex allergy status: Secondary | ICD-10-CM | POA: Insufficient documentation

## 2023-01-29 DIAGNOSIS — Z23 Encounter for immunization: Secondary | ICD-10-CM | POA: Insufficient documentation

## 2023-01-29 DIAGNOSIS — L0231 Cutaneous abscess of buttock: Secondary | ICD-10-CM | POA: Insufficient documentation

## 2023-01-29 MED ORDER — FENTANYL CITRATE PF 50 MCG/ML IJ SOSY
50.0000 ug | PREFILLED_SYRINGE | Freq: Once | INTRAMUSCULAR | Status: AC
  Administered 2023-01-30: 50 ug via INTRAVENOUS
  Filled 2023-01-29: qty 1

## 2023-01-29 MED ORDER — SODIUM CHLORIDE 0.9 % IV BOLUS
500.0000 mL | Freq: Once | INTRAVENOUS | Status: AC
  Administered 2023-01-30: 500 mL via INTRAVENOUS

## 2023-01-29 MED ORDER — ONDANSETRON HCL 4 MG/2ML IJ SOLN
4.0000 mg | Freq: Once | INTRAMUSCULAR | Status: AC
  Administered 2023-01-30: 4 mg via INTRAVENOUS
  Filled 2023-01-29: qty 2

## 2023-01-29 NOTE — ED Provider Notes (Signed)
[place] [facility] Provider Note   CSN: [id number] Arrival date & time: 01/29/23  2045     History  Chief Complaint  Patient presents with   Cyst    Sierra Kirk is a 32 y.o. female.  The history is provided by the patient and medical records.  Sierra Kirk is a 32 y.o. female who presents to the Emergency Department complaining of cyst.  She presents to the emergency department complaining of pilonidal cyst that became painful and swollen about 4 days ago.  She does have subjective fevers with nausea and chills.  She has a history of recurrent skin infections but is not currently on any medications.     Home Medications Prior to Admission medications   Medication Sig Start Date End Date Taking? Authorizing Provider  clindamycin (CLEOCIN) 150 MG capsule Take 3 capsules (450 mg total) by mouth 3 (three) times daily for 7 days. 01/30/23 02/06/23 Yes Tilden Fossa, MD  HYDROcodone-acetaminophen (NORCO/VICODIN) 5-325 MG tablet Take 1 tablet by mouth every 6 (six) hours as needed. 01/30/23  Yes Tilden Fossa, MD      Allergies    Latex    Review of Systems   Review of Systems  All other systems reviewed and are negative.   Physical Exam Updated Vital Signs BP 119/84 (BP Location: Right Arm)   Pulse 85   Temp 98.4 F (36.9 C) (Oral)   Resp 18   Ht 5\' 10"  (1.778 m)   Wt 133.8 kg   LMP 01/02/2023 (Approximate)   SpO2 99%   BMI 42.33 kg/m  Physical Exam Vitals and nursing note reviewed.  Constitutional:      Appearance: She is well-developed.  HENT:     Head: Normocephalic and atraumatic.  Cardiovascular:     Rate and Rhythm: Normal rate and regular rhythm.  Pulmonary:     Effort: Pulmonary effort is normal. No respiratory distress.  Abdominal:     Palpations: Abdomen is soft.     Tenderness: There is no abdominal tenderness. There is no guarding or rebound.  Genitourinary:    Comments: There is a large amount of  induration, swelling and tenderness over the right labia extending to the rectum.  There are 2 areas of spontaneous drainage of large quantities of purulent material.  1 area is over the Bartholin's gland and the second area is more posterior.  There is a 1 x 2 cm area of swelling and fluctuance in the gluteal cleft with a small amount of spontaneous drainage. Musculoskeletal:        General: No tenderness.  Skin:    General: Skin is warm and dry.  Neurological:     Mental Status: She is alert and oriented to person, place, and time.  Psychiatric:        Behavior: Behavior normal.     ED Results / Procedures / Treatments   Labs (all labs ordered are listed, but only abnormal results are displayed) Labs Reviewed  COMPREHENSIVE METABOLIC PANEL - Abnormal; Notable for the following components:      Result Value   Potassium 3.4 (*)    Glucose, Bld 111 (*)    Calcium 8.1 (*)    All other components within normal limits  CBC WITH DIFFERENTIAL/PLATELET - Abnormal; Notable for the following components:   WBC 14.9 (*)    Hemoglobin 10.5 (*)    HCT 33.2 (*)    MCV 70.0 (*)    MCH 22.2 (*)  RDW 15.8 (*)    Neutro Abs 11.7 (*)    Monocytes Absolute 1.5 (*)    All other components within normal limits  AEROBIC CULTURE W GRAM STAIN (SUPERFICIAL SPECIMEN)  HCG, SERUM, QUALITATIVE    EKG None  Radiology CT ABDOMEN PELVIS W CONTRAST  Result Date: 01/30/2023 CLINICAL DATA:  Right buttock infection for several days, initial encounter EXAM: CT ABDOMEN AND PELVIS WITH CONTRAST TECHNIQUE: Multidetector CT imaging of the abdomen and pelvis was performed using the standard protocol following bolus administration of intravenous contrast. RADIATION DOSE REDUCTION: This exam was performed according to the departmental dose-optimization program which includes automated exposure control, adjustment of the mA and/or kV according to patient size and/or use of iterative reconstruction technique.  CONTRAST:  OMNIPAQUE IOHEXOL 300 MG/ML  SOLN COMPARISON:  None Available. FINDINGS: Lower chest: No acute abnormality. Hepatobiliary: No focal liver abnormality is seen. No gallstones, gallbladder wall thickening, or biliary dilatation. Pancreas: Unremarkable. No pancreatic ductal dilatation or surrounding inflammatory changes. Spleen: Normal in size without focal abnormality. Adrenals/Urinary Tract: Adrenal glands are within normal limits. Normal enhancement of the kidneys is seen. No obstructive changes are noted. Bladder is decompressed. Stomach/Bowel: Scattered diverticular change of the colon is noted without evidence of diverticulitis. The index is within normal limits. Small bowel and stomach are unremarkable. Vascular/Lymphatic: Multiple ovarian vein phleboliths are noted on the left. No arterial abnormality is noted. No lymphadenopathy is seen. Reproductive: Uterus and bilateral adnexa are unremarkable. Other: No abdominal wall hernia or abnormality. No abdominopelvic ascites. Musculoskeletal: Mild induration is noted along the aspect of the right buttock extending towards the perineum. No definitive subcutaneous abscess is seen. No bony abnormality is noted. IMPRESSION: Mild induration in the right buttock medially and inferiorly without discrete abscess. This corresponds to the patient's given clinical history and is most likely related to cellulitis. Diverticulosis without diverticulitis. Electronically Signed   By: Alcide Clever M.D.   On: 01/30/2023 01:03    Procedures .Marland KitchenIncision and Drainage  Date/Time: 01/30/2023 2:44 AM  Performed by: Tilden Fossa, MD Authorized by: Tilden Fossa, MD   Consent:    Consent obtained:  Verbal   Consent given by:  Patient   Risks discussed:  Bleeding, incomplete drainage, pain and infection   Alternatives discussed:  Observation Universal protocol:    Patient identity confirmed:  Verbally with patient Location:    Type:  Abscess   Location:   Anogenital   Anogenital location:  Perineum Pre-procedure details:    Skin preparation:  Povidone-iodine Sedation:    Sedation type:  None Anesthesia:    Anesthesia method:  Local infiltration   Local anesthetic:  Lidocaine 2% WITH epi Procedure type:    Complexity:  Complex Procedure details:    Incision types:  Single straight   Wound management:  Probed and deloculated and irrigated with saline   Drainage:  Bloody and purulent   Drainage amount:  Copious   Wound treatment:  Wound left open Post-procedure details:    Procedure completion:  Tolerated well, no immediate complications     Medications Ordered in ED Medications  sodium chloride 0.9 % bolus 500 mL ( Intravenous Stopped 01/30/23 0124)  fentaNYL (SUBLIMAZE) injection 50 mcg (50 mcg Intravenous Given 01/30/23 0005)  ondansetron (ZOFRAN) injection 4 mg (4 mg Intravenous Given 01/30/23 0005)  iohexol (OMNIPAQUE) 300 MG/ML solution 125 mL (125 mLs Intravenous Contrast Given 01/30/23 0040)  fentaNYL (SUBLIMAZE) injection 50 mcg (50 mcg Intravenous Given 01/30/23 0136)  Tdap (BOOSTRIX) injection 0.5  mL (0.5 mLs Intramuscular Given 01/30/23 0226)  clindamycin (CLEOCIN) capsule 450 mg (450 mg Oral Given 01/30/23 0226)  lidocaine-EPINEPHrine (XYLOCAINE W/EPI) 2 %-1:200000 (PF) injection 20 mL (20 mLs Infiltration Given 01/30/23 0219)    ED Course/ Medical Decision Making/ A&P                             Medical Decision Making Amount and/or Complexity of Data Reviewed Labs: ordered. Radiology: ordered.  Risk Prescription drug management.   Patient with history of hidradenitis here for evaluation of pain and swelling in her groin.  At time of initial assessment patient with copious amount of spontaneous drainage from an abscess involving her right labia and extending into the perirectal area.  Given extent of abscess a CT abdomen pelvis was obtained, which is consistent with cellulitis.  After resulting of imaging discussed  with patient risk and benefit of incision and drainage to ensure that there is not recollection of this fluid.  I&D was performed and patient tolerated procedure well.  She will be started on antibiotics given degree of cellulitis.  Current clinical picture is not consistent with necrotizing soft tissue infection.  She does incidentally have fullness in the pilonidal region-patient states this is a chronic skin change since her prior I&D of pilonidal abscess.  Discussed close return precautions if she has progressive or new concerning symptoms.        Final Clinical Impression(s) / ED Diagnoses Final diagnoses:  Abscess  Cellulitis of buttock    Rx / DC Orders ED Discharge Orders          Ordered    HYDROcodone-acetaminophen (NORCO/VICODIN) 5-325 MG tablet  Every 6 hours PRN        01/30/23 0217    clindamycin (CLEOCIN) 150 MG capsule  3 times daily        01/30/23 0217              Tilden Fossa, MD 01/30/23 0246

## 2023-01-29 NOTE — ED Triage Notes (Signed)
Patient arrived via POV c/o cyst on right buttock x 4 days. Patient unable to sit at this time. Patient states 7/10 pain. Patient states hx of same. Patient is AO x 4, VS WDL, normal gait.

## 2023-01-30 ENCOUNTER — Emergency Department (HOSPITAL_BASED_OUTPATIENT_CLINIC_OR_DEPARTMENT_OTHER): Payer: 59

## 2023-01-30 DIAGNOSIS — L0231 Cutaneous abscess of buttock: Secondary | ICD-10-CM | POA: Diagnosis not present

## 2023-01-30 LAB — COMPREHENSIVE METABOLIC PANEL
ALT: 11 U/L (ref 0–44)
AST: 19 U/L (ref 15–41)
Albumin: 3.5 g/dL (ref 3.5–5.0)
Alkaline Phosphatase: 45 U/L (ref 38–126)
Anion gap: 9 (ref 5–15)
BUN: 11 mg/dL (ref 6–20)
CO2: 24 mmol/L (ref 22–32)
Calcium: 8.1 mg/dL — ABNORMAL LOW (ref 8.9–10.3)
Chloride: 102 mmol/L (ref 98–111)
Creatinine, Ser: 0.78 mg/dL (ref 0.44–1.00)
GFR, Estimated: >60 mL/min (ref >60)
Glucose, Bld: 111 mg/dL — ABNORMAL HIGH (ref 70–99)
Potassium: 3.4 mmol/L — ABNORMAL LOW (ref 3.5–5.1)
Sodium: 135 mmol/L (ref 135–145)
Total Bilirubin: 0.5 mg/dL (ref 0.3–1.2)
Total Protein: 7.9 g/dL (ref 6.5–8.1)

## 2023-01-30 LAB — CBC WITH DIFFERENTIAL/PLATELET
Abs Immature Granulocytes: 0.06 10*3/uL (ref 0.00–0.07)
Basophils Absolute: 0 10*3/uL (ref 0.0–0.1)
Basophils Relative: 0 %
Eosinophils Absolute: 0 10*3/uL (ref 0.0–0.5)
Eosinophils Relative: 0 %
HCT: 33.2 % — ABNORMAL LOW (ref 36.0–46.0)
Hemoglobin: 10.5 g/dL — ABNORMAL LOW (ref 12.0–15.0)
Immature Granulocytes: 0 %
Lymphocytes Relative: 11 %
Lymphs Abs: 1.6 10*3/uL (ref 0.7–4.0)
MCH: 22.2 pg — ABNORMAL LOW (ref 26.0–34.0)
MCHC: 31.6 g/dL (ref 30.0–36.0)
MCV: 70 fL — ABNORMAL LOW (ref 80.0–100.0)
Monocytes Absolute: 1.5 10*3/uL — ABNORMAL HIGH (ref 0.1–1.0)
Monocytes Relative: 10 %
Neutro Abs: 11.7 10*3/uL — ABNORMAL HIGH (ref 1.7–7.7)
Neutrophils Relative %: 79 %
Platelets: 247 10*3/uL (ref 150–400)
RBC: 4.74 MIL/uL (ref 3.87–5.11)
RDW: 15.8 % — ABNORMAL HIGH (ref 11.5–15.5)
WBC: 14.9 10*3/uL — ABNORMAL HIGH (ref 4.0–10.5)
nRBC: 0 % (ref 0.0–0.2)

## 2023-01-30 LAB — AEROBIC CULTURE W GRAM STAIN (SUPERFICIAL SPECIMEN)

## 2023-01-30 LAB — HCG, SERUM, QUALITATIVE: Preg, Serum: NEGATIVE

## 2023-01-30 MED ORDER — HYDROCODONE-ACETAMINOPHEN 5-325 MG PO TABS: 1.0000 | ORAL_TABLET | Freq: Four times a day (QID) | ORAL | 0 refills | Status: AC | PRN

## 2023-01-30 MED ORDER — FENTANYL CITRATE PF 50 MCG/ML IJ SOSY
50.0000 ug | PREFILLED_SYRINGE | Freq: Once | INTRAMUSCULAR | Status: AC
  Administered 2023-01-30: 50 ug via INTRAVENOUS
  Filled 2023-01-30: qty 1

## 2023-01-30 MED ORDER — IOHEXOL 300 MG/ML  SOLN
125.0000 mL | Freq: Once | INTRAMUSCULAR | Status: AC | PRN
  Administered 2023-01-30: 125 mL via INTRAVENOUS

## 2023-01-30 MED ORDER — LIDOCAINE-EPINEPHRINE 2 %-1:100000 IJ SOLN: 20.0000 mL | Freq: Once | INTRAMUSCULAR | Status: DC

## 2023-01-30 MED ORDER — LIDOCAINE-EPINEPHRINE (PF) 2 %-1:200000 IJ SOLN: 20.0000 mL | Freq: Once | INTRAMUSCULAR | Status: AC

## 2023-01-30 MED ORDER — TETANUS-DIPHTH-ACELL PERTUSSIS 5-2.5-18.5 LF-MCG/0.5 IM SUSY
0.5000 mL | PREFILLED_SYRINGE | Freq: Once | INTRAMUSCULAR | Status: AC
  Administered 2023-01-30: 0.5 mL via INTRAMUSCULAR
  Filled 2023-01-30: qty 0.5

## 2023-01-30 MED ORDER — CLINDAMYCIN HCL 150 MG PO CAPS: 450.0000 mg | ORAL_CAPSULE | Freq: Three times a day (TID) | ORAL | 0 refills | Status: AC

## 2023-01-30 MED ORDER — LIDOCAINE-EPINEPHRINE (PF) 2 %-1:200000 IJ SOLN
INTRAMUSCULAR | Status: AC
  Administered 2023-01-30: 20 mL
  Filled 2023-01-30: qty 20

## 2023-01-30 MED ORDER — CLINDAMYCIN HCL 150 MG PO CAPS
450.0000 mg | ORAL_CAPSULE | Freq: Once | ORAL | Status: AC
  Administered 2023-01-30: 450 mg via ORAL
  Filled 2023-01-30: qty 3

## 2023-02-01 LAB — AEROBIC CULTURE W GRAM STAIN (SUPERFICIAL SPECIMEN)

## 2023-02-02 LAB — AEROBIC CULTURE W GRAM STAIN (SUPERFICIAL SPECIMEN): Gram Stain: NONE SEEN

## 2023-02-03 ENCOUNTER — Telehealth (HOSPITAL_BASED_OUTPATIENT_CLINIC_OR_DEPARTMENT_OTHER): Payer: Self-pay

## 2023-02-03 NOTE — Telephone Encounter (Signed)
Post ED Visit - Positive Culture Follow-up: Successful Patient Follow-Up  Culture assessed and recommendations reviewed by:   Eldridge Scot, Pharm.D.  Celedonio Miyamoto, Pharm.D., BCPS AQ-ID  Garvin Fila, Pharm.D., BCPS  Georgina Pillion, Pharm.D., BCPS  [place], [address].D., BCPS, AAHIVP  Estella Husk, Pharm.D., BCPS, AAHIVP  Lysle Pearl, PharmD, BCPS  Phillips Climes, PharmD, BCPS  Agapito Games, PharmD, BCPS  Verlan Friends, PharmD  Positive urine culture   Patient discharged without antimicrobial prescription and treatment is now indicated  Organism is resistant to prescribed ED discharge antimicrobial  Patient with positive blood cultures  Changes discussed with ED provider: Jacalyn Lefevre, MD New antibiotic prescription Bactrim DS 1 tablet po BID x 7 days Called to [facility]   Contacted patient, date 02/03/23, time 12:25 pm   Sandria Senter 02/03/2023, 12:28 PM

## 2023-02-03 NOTE — Progress Notes (Signed)
ED Antimicrobial Stewardship Positive Culture Follow Up   Sierra Kirk is an 32 y.o. female who presented to [facility] on 01/29/2023 with a chief complaint of  Chief Complaint  Patient presents with   Cyst    Recent Results (from the past 720 hour(s))  Aerobic Culture w Gram Stain (superficial specimen)     Status: None   Collection Time: 01/30/23  2:42 AM   Specimen: Labia; Wound  Result Value Ref Range Status   Specimen Description   Final    LABIA Performed at [facility], [address] [place], [place] [address]    Special Requests   Final    NONE Performed at [facility], [address] [place], [place] [address]    Gram Stain NO WBC SEEN NO ORGANISMS SEEN   Final   Culture   Final    RARE STAPHYLOCOCCUS EPIDERMIDIS RARE DIPHTHEROIDS(CORYNEBACTERIUM SPECIES) Standardized susceptibility testing for this organism is not available. Performed at [facility], [address] [place], [place] [address]    Report Status 02/02/2023 FINAL  Final   Organism ID, Bacteria STAPHYLOCOCCUS EPIDERMIDIS  Final      Susceptibility   Staphylococcus epidermidis - MIC*    CIPROFLOXACIN <=0.5 SENSITIVE Sensitive     ERYTHROMYCIN >=8 RESISTANT Resistant     GENTAMICIN <=0.5 SENSITIVE Sensitive     OXACILLIN <=0.25 SENSITIVE Sensitive     TETRACYCLINE <=1 SENSITIVE Sensitive     VANCOMYCIN 2 SENSITIVE Sensitive     TRIMETH/SULFA 40 SENSITIVE Sensitive     CLINDAMYCIN RESISTANT Resistant     RIFAMPIN <=0.5 SENSITIVE Sensitive     Inducible Clindamycin POSITIVE Resistant     * RARE STAPHYLOCOCCUS EPIDERMIDIS     Treated with clindamycin, organism resistant to prescribed antimicrobial  Recommend stopping clindamycin and starting Bactrim 1 DS tablet PO BID x 7 days  New antibiotic prescription: Bactrim 1 DS tablet PO BID x 7 days  ED Provider: Jacalyn Lefevre, MD   [name] 02/03/2023, 12:08 PM Clinical Pharmacist Monday - Friday phone -   [phone] Saturday - Sunday phone - [phone]

## 2023-04-08 ENCOUNTER — Emergency Department (HOSPITAL_BASED_OUTPATIENT_CLINIC_OR_DEPARTMENT_OTHER)
Admission: EM | Admit: 2023-04-08 | Discharge: 2023-04-08 | Disposition: A | Discharge status: Home / Self Care | Payer: 59 | Attending: Emergency Medicine | Admitting: Emergency Medicine

## 2023-04-08 ENCOUNTER — Encounter (HOSPITAL_BASED_OUTPATIENT_CLINIC_OR_DEPARTMENT_OTHER): Payer: Self-pay

## 2023-04-08 ENCOUNTER — Other Ambulatory Visit: Payer: Self-pay

## 2023-04-08 DIAGNOSIS — L0501 Pilonidal cyst with abscess: Secondary | ICD-10-CM

## 2023-04-08 DIAGNOSIS — L732 Hidradenitis suppurativa: Secondary | ICD-10-CM | POA: Diagnosis not present

## 2023-04-08 MED ORDER — CHLORHEXIDINE GLUCONATE 4 % EX SOLN: Freq: Every day | CUTANEOUS | 0 refills | Status: AC | PRN

## 2023-04-08 MED ORDER — CLINDAMYCIN PHOSPHATE 1 % EX LOTN: TOPICAL_LOTION | Freq: Two times a day (BID) | CUTANEOUS | 0 refills | Status: AC

## 2023-04-08 NOTE — ED Notes (Signed)
ED Provider at bedside. 

## 2023-04-08 NOTE — ED Triage Notes (Signed)
Reports that she has a cyst on left inner thigh.

## 2023-04-08 NOTE — ED Provider Notes (Signed)
[place] [facility] Provider Note  CSN: [id number] Arrival date & time: 04/08/23 4098  Chief Complaint(s) Abscess  HPI Sierra Kirk is a 32 y.o. female with past medical history as below, significant for prior pilonidal cyst, C-section, hidradenitis who presents to the ED with complaint of cyst vs abscess or thigh.  Reports she noticed discomfort to her inner thigh approximately 3 days ago, has been progressively worsening.  Worsening pain with walking because the area rubs against her contralateral thigh.  No drainage.  No fevers.  No nausea or vomiting.  Similar to prior outbreaks of hidradenitis in the past.  She follows with dermatology but is unsure the dermatology office.  Reports that she is on chronic antibiotics, believes it is clindamycin but she is unsure.  No diarrhea, no urinary complaints.  No abdominal pain, no GU concerns.  Past Medical History Past Medical History:  Diagnosis Date   Boil of buttock    Pneumonia    There are no problems to display for this patient.  Home Medication(s) Prior to Admission medications   Medication Sig Start Date End Date Taking? Authorizing Provider  chlorhexidine (HIBICLENS) 4 % external liquid Apply topically daily as needed. 04/08/23  Yes Tanda Rockers A, DO  clindamycin (CLEOCIN-T) 1 % lotion Apply topically 2 (two) times daily. 04/08/23  Yes Sloan Leiter, DO  HYDROcodone-acetaminophen (NORCO/VICODIN) 5-325 MG tablet Take 1 tablet by mouth every 6 (six) hours as needed. 01/30/23   Tilden Fossa, MD                                                                                                                                    Past Surgical History Past Surgical History:  Procedure Laterality Date   CESAREAN SECTION     WISDOM TOOTH EXTRACTION     Family History History reviewed. No pertinent family history.  Social History Social History   Tobacco Use   Smoking status: Former    Types:  Cigarettes   Smokeless tobacco: Never  Vaping Use   Vaping Use: Never used  Substance Use Topics   Alcohol use: Yes    Comment: occ   Drug use: Not Currently    Types: Marijuana   Allergies Latex  Review of Systems Review of Systems  Constitutional:  Negative for activity change and fever.  HENT:  Negative for facial swelling and trouble swallowing.   Eyes:  Negative for discharge and redness.  Respiratory:  Negative for cough and shortness of breath.   Cardiovascular:  Negative for chest pain and palpitations.  Gastrointestinal:  Negative for abdominal pain and nausea.  Genitourinary:  Negative for dysuria and flank pain.  Musculoskeletal:  Negative for back pain and gait problem.  Skin:  Positive for wound. Negative for pallor and rash.  Neurological:  Negative for syncope and headaches.    Physical Exam Vital Signs  I have reviewed the  triage vital signs BP 122/89 (BP Location: Left Arm)   Pulse 66   Temp 97.7 F (36.5 C) (Oral)   Resp 18   Ht 5\' 10"  (1.778 m)   Wt 135.2 kg   LMP 04/05/2023 (Exact Date)   SpO2 100%   BMI 42.76 kg/m  Physical Exam Vitals and nursing note reviewed. Exam conducted with a chaperone present Haematologist [place]).  Constitutional:      General: She is not in acute distress.    Appearance: Normal appearance. She is obese. She is not ill-appearing.  HENT:     Head: Normocephalic and atraumatic.     Right Ear: External ear normal.     Left Ear: External ear normal.     Nose: Nose normal.     Mouth/Throat:     Mouth: Mucous membranes are moist.  Eyes:     General: No scleral icterus.       Right eye: No discharge.        Left eye: No discharge.  Cardiovascular:     Rate and Rhythm: Normal rate and regular rhythm.     Pulses: Normal pulses.  Pulmonary:     Effort: Pulmonary effort is normal. No respiratory distress.     Breath sounds: No stridor.  Abdominal:     General: Abdomen is flat.     Palpations: Abdomen is soft.      Tenderness: There is no abdominal tenderness.  Musculoskeletal:     Right lower leg: No edema.     Left lower leg: No edema.  Skin:    General: Skin is warm and dry.     Capillary Refill: Capillary refill takes less than 2 seconds.       Neurological:     Mental Status: She is alert.  Psychiatric:        Mood and Affect: Mood normal.        Behavior: Behavior normal.     ED Results and Treatments Labs (all labs ordered are listed, but only abnormal results are displayed) Labs Reviewed - No data to display                                                                                                                        Radiology No results found.  Pertinent labs & imaging results that were available during my care of the patient were reviewed by me and considered in my medical decision making (see MDM for details).  Medications Ordered in ED Medications - No data to display  Procedures Procedures  (including critical care time)  Medical Decision Making / ED Course    Medical Decision Making:    Tramya Hylton is a 32 y.o. female with past medical history as below, significant for prior pilonidal cyst, C-section who presents to the ED with complaint of cyst/?abscess of thigh. The complaint involves an extensive differential diagnosis and also carries with it a high risk of complications and morbidity.  Serious etiology was considered. Ddx includes but is not limited to: Hidradenitis, cellulitis, abscess,  Complete initial physical exam performed, notably the patient  was no acute distress, HDS, watching video on cell phone. Reviewed and confirmed nursing documentation for past medical history, family history, social history.  Vital signs reviewed.      Patient here with hidradenitis suppurativa to her left inguinal region.  No surrounding  cellulitis.  Recommend topical clindamycin and antiseptic cleanser.  Follow-up with her dermatologist She has recurrent pilonidal abscess, draining today, I was able to express moderate amount of malodorous purulent discharge.  No surrounding cellulitis.  That she is already taking clindamycin.  Advised her to follow-up with general surgery in regards to recurrent pilonidal cyst/abscess.  The patient improved significantly and was discharged in stable condition. Detailed discussions were had with the patient regarding current findings, and need for close f/u with PCP or on call doctor. The patient has been instructed to return immediately if the symptoms worsen in any way for re-evaluation. Patient verbalized understanding and is in agreement with current care plan. All questions answered prior to discharge.       Additional history obtained: -Additional history obtained from na -External records from outside source obtained and reviewed including: Chart review including previous notes, labs, imaging, consultation notes including prior ed visits, home meds   Lab Tests: na  EKG   EKG Interpretation  Date/Time:    Ventricular Rate:    PR Interval:    QRS Duration:   QT Interval:    QTC Calculation:   R Axis:     Text Interpretation:           Imaging Studies ordered: na   Medicines ordered and prescription drug management: Meds ordered this encounter  Medications   clindamycin (CLEOCIN-T) 1 % lotion    Sig: Apply topically 2 (two) times daily.    Dispense:  60 mL    Refill:  0   chlorhexidine (HIBICLENS) 4 % external liquid    Sig: Apply topically daily as needed.    Dispense:  236 mL    Refill:  0    -I have reviewed the patients home medicines and have made adjustments as needed   Consultations Obtained: na   Cardiac Monitoring: Pulse oximetry 99-100% on RA   Social Determinants of Health:  Diagnosis or treatment significantly limited by social  determinants of health: former smoker and obesity   Reevaluation: After the interventions noted above, I reevaluated the patient and found that they have stayed the same  Co morbidities that complicate the patient evaluation  Past Medical History:  Diagnosis Date   Boil of buttock    Pneumonia       Dispostion: Disposition decision including need for hospitalization was considered, and patient discharged from emergency department.    Final Clinical Impression(s) / ED Diagnoses Final diagnoses:  Hidradenitis suppurativa  Pilonidal cyst with abscess     This chart was dictated using voice recognition software.  Despite best efforts to proofread,  errors can occur which can change the  documentation meaning.    Sloan Leiter, DO 04/08/23 [phone]

## 2023-04-08 NOTE — Discharge Instructions (Signed)
It was a pleasure caring for you today in the emergency department.  Please return to the emergency department for any worsening or worrisome symptoms.  Please follow-up with your dermatology provider and with general surgery regards to recurrent pilonidal cyst and hidradenitis.  Continue taking your clindamycin.  Please apply clindamycin topical cream to the hidradenitis.  Please use Hibiclens as directed
